# Patient Record
Sex: Female | Born: 1970
Health system: Southern US, Community
[De-identification: ages and names within clinical notes are randomized; demographics above are authoritative.]

## PROBLEM LIST (undated history)

## (undated) DIAGNOSIS — D649 Anemia, unspecified: Secondary | ICD-10-CM

## (undated) DIAGNOSIS — J309 Allergic rhinitis, unspecified: Secondary | ICD-10-CM

## (undated) DIAGNOSIS — D509 Iron deficiency anemia, unspecified: Secondary | ICD-10-CM

## (undated) DIAGNOSIS — Z86010 Personal history of colonic polyps: Secondary | ICD-10-CM

## (undated) HISTORY — DX: Iron deficiency anemia, unspecified: D50.9

## (undated) HISTORY — DX: Allergic rhinitis, unspecified: J30.9

## (undated) HISTORY — DX: Anemia, unspecified: D64.9

## (undated) HISTORY — DX: Personal history of colonic polyps: Z86.010

## (undated) HISTORY — PX: INTRAUTERINE DEVICE INSERTION: SHX323

---

## 1999-05-20 ENCOUNTER — Other Ambulatory Visit: Admission: RE | Admit: 1999-05-20 | Discharge: 1999-05-20 | Payer: Self-pay | Admitting: Obstetrics and Gynecology

## 2000-06-20 ENCOUNTER — Other Ambulatory Visit: Admission: RE | Admit: 2000-06-20 | Discharge: 2000-06-20 | Payer: Self-pay | Admitting: Obstetrics and Gynecology

## 2001-12-07 ENCOUNTER — Other Ambulatory Visit: Admission: RE | Admit: 2001-12-07 | Discharge: 2001-12-07 | Payer: Self-pay | Admitting: Obstetrics and Gynecology

## 2003-02-05 ENCOUNTER — Other Ambulatory Visit: Admission: RE | Admit: 2003-02-05 | Discharge: 2003-02-05 | Payer: Self-pay | Admitting: Obstetrics and Gynecology

## 2003-02-06 ENCOUNTER — Other Ambulatory Visit: Admission: RE | Admit: 2003-02-06 | Discharge: 2003-02-06 | Payer: Self-pay | Admitting: Obstetrics and Gynecology

## 2003-09-18 ENCOUNTER — Inpatient Hospital Stay (HOSPITAL_COMMUNITY): Admission: AD | Admit: 2003-09-18 | Discharge: 2003-09-22 | Payer: Self-pay | Admitting: Obstetrics and Gynecology

## 2003-09-19 ENCOUNTER — Encounter (INDEPENDENT_AMBULATORY_CARE_PROVIDER_SITE_OTHER): Payer: Self-pay | Admitting: Specialist

## 2003-09-23 ENCOUNTER — Encounter: Admission: RE | Admit: 2003-09-23 | Discharge: 2003-10-23 | Payer: Self-pay | Admitting: Obstetrics and Gynecology

## 2003-10-24 ENCOUNTER — Encounter: Admission: RE | Admit: 2003-10-24 | Discharge: 2003-11-23 | Payer: Self-pay | Admitting: Obstetrics and Gynecology

## 2003-12-22 ENCOUNTER — Encounter: Admission: RE | Admit: 2003-12-22 | Discharge: 2004-01-21 | Payer: Self-pay | Admitting: Obstetrics and Gynecology

## 2004-04-20 ENCOUNTER — Other Ambulatory Visit: Admission: RE | Admit: 2004-04-20 | Discharge: 2004-04-20 | Payer: Self-pay | Admitting: Obstetrics and Gynecology

## 2004-08-15 ENCOUNTER — Emergency Department (HOSPITAL_COMMUNITY): Admission: EM | Admit: 2004-08-15 | Discharge: 2004-08-15 | Payer: Self-pay | Admitting: Family Medicine

## 2005-09-24 ENCOUNTER — Inpatient Hospital Stay (HOSPITAL_COMMUNITY): Admission: RE | Admit: 2005-09-24 | Discharge: 2005-09-28 | Payer: Self-pay | Admitting: Obstetrics and Gynecology

## 2005-09-29 ENCOUNTER — Encounter: Admission: RE | Admit: 2005-09-29 | Discharge: 2005-10-29 | Payer: Self-pay | Admitting: Obstetrics and Gynecology

## 2005-10-26 ENCOUNTER — Other Ambulatory Visit: Admission: RE | Admit: 2005-10-26 | Discharge: 2005-10-26 | Payer: Self-pay | Admitting: Obstetrics and Gynecology

## 2005-10-30 ENCOUNTER — Encounter: Admission: RE | Admit: 2005-10-30 | Discharge: 2005-11-26 | Payer: Self-pay | Admitting: Obstetrics and Gynecology

## 2005-11-27 ENCOUNTER — Encounter: Admission: RE | Admit: 2005-11-27 | Discharge: 2005-12-27 | Payer: Self-pay | Admitting: Obstetrics and Gynecology

## 2005-12-28 ENCOUNTER — Encounter: Admission: RE | Admit: 2005-12-28 | Discharge: 2005-12-31 | Payer: Self-pay | Admitting: Obstetrics and Gynecology

## 2008-04-30 ENCOUNTER — Ambulatory Visit: Payer: Self-pay | Admitting: Internal Medicine

## 2008-04-30 DIAGNOSIS — D509 Iron deficiency anemia, unspecified: Secondary | ICD-10-CM

## 2008-04-30 DIAGNOSIS — Z860100 Personal history of colon polyps, unspecified: Secondary | ICD-10-CM | POA: Insufficient documentation

## 2008-04-30 DIAGNOSIS — D649 Anemia, unspecified: Secondary | ICD-10-CM

## 2008-04-30 DIAGNOSIS — H9190 Unspecified hearing loss, unspecified ear: Secondary | ICD-10-CM | POA: Insufficient documentation

## 2008-04-30 HISTORY — DX: Anemia, unspecified: D64.9

## 2008-04-30 HISTORY — DX: Personal history of colon polyps, unspecified: Z86.0100

## 2008-04-30 HISTORY — DX: Iron deficiency anemia, unspecified: D50.9

## 2008-04-30 HISTORY — DX: Personal history of colonic polyps: Z86.010

## 2008-09-06 HISTORY — PX: INTRAUTERINE DEVICE INSERTION: SHX323

## 2008-10-18 ENCOUNTER — Ambulatory Visit: Payer: Self-pay | Admitting: Internal Medicine

## 2008-10-18 DIAGNOSIS — R059 Cough, unspecified: Secondary | ICD-10-CM | POA: Insufficient documentation

## 2008-10-18 DIAGNOSIS — R5381 Other malaise: Secondary | ICD-10-CM

## 2008-10-18 DIAGNOSIS — R5383 Other fatigue: Secondary | ICD-10-CM

## 2008-10-18 DIAGNOSIS — J309 Allergic rhinitis, unspecified: Secondary | ICD-10-CM

## 2008-10-18 DIAGNOSIS — R05 Cough: Secondary | ICD-10-CM

## 2008-10-18 DIAGNOSIS — J069 Acute upper respiratory infection, unspecified: Secondary | ICD-10-CM | POA: Insufficient documentation

## 2008-10-18 DIAGNOSIS — N390 Urinary tract infection, site not specified: Secondary | ICD-10-CM

## 2008-10-18 HISTORY — DX: Allergic rhinitis, unspecified: J30.9

## 2008-10-18 LAB — CONVERTED CEMR LAB
Glucose, Urine, Semiquant: NEGATIVE
Nitrite: POSITIVE
Protein, U semiquant: NEGATIVE
WBC Urine, dipstick: NEGATIVE

## 2008-10-19 ENCOUNTER — Encounter: Payer: Self-pay | Admitting: Internal Medicine

## 2011-01-22 NOTE — Discharge Summary (Signed)
NAME:  Sarah Riley, Sarah Riley NO.:  [id number]   MEDICAL RECORD NO.:  [id number]          PATIENT TYPE:  INP   LOCATION:  9146                          FACILITY:  [facility]   PHYSICIAN:  Carrington Clamp, M.D. DATE OF BIRTH:  11/20/1970   DATE OF ADMISSION:  09/24/2005  DATE OF DISCHARGE:  09/28/2005                                 DISCHARGE SUMMARY   FINAL DIAGNOSES:  1.  Intrauterine gestation at 38+ weeks gestation.  2.  History of prior cesarean section, desires repeat cesarean section in      early labor.  3.  Thick meconium-stained amniotic fluid.   PROCEDURE:  Repeat low transverse cesarean section. Surgeon:  Dr. Conley Simmonds. Assistant:  Dr. Carrington Clamp. Complications:  None.   This 40 year old G3 P1-0-1-1 presents at 36 and four-sevenths weeks  gestation in early labor. The patient had had a prior cesarean section in  2005 and expressed her desires throughout this pregnancy for a repeat  cesarean section. The patient declined any trial of vaginal delivery. The  patient's antepartum course at this point was complicated by some late  prenatal care. The patient was not seen in our office to close to 13-14  weeks, and otherwise the antepartum course was uncomplicated. She was taken  to the operating room on September 24, 2005, by Dr. Conley Simmonds where a repeat  low transverse cesarean section was performed with the delivery of a 7-pound  6-ounce female infant with Apgars of 9 and 9. Delivery went without  complications. The patient's postoperative course was benign without any  significant fevers. The baby was in the NICU secondary to the heavy, thick  meconium fluid, was now breathing on its own. The patient was kept in the  hospital until postoperative day #4. Was sent home on a regular diet, told  to decrease activities, told to continue prenatal vitamins, was given  Percocet one to two every 4 hours as needed for pain, told she could use  over-the-counter  ibuprofen up to 600 mg every 6 hours as needed for pain,  was to follow up in the office in 4-6 weeks.   LABORATORY ON DISCHARGE:  The patient had a hemoglobin of 8.9; white blood  cell count of 10.0; and platelets of 195,000.      Leilani Able, P.A.-C.      Carrington Clamp, M.D.  Electronically Signed    MB/MEDQ  D:  10/11/2005  T:  10/11/2005  Job:  [id number]

## 2011-01-22 NOTE — Op Note (Signed)
NAME:  Sarah Riley, Sarah Riley NO.:  [id number]   MEDICAL RECORD NO.:  [id number]                   PATIENT TYPE:  INP   LOCATION:  9106                                 FACILITY:  [facility]   PHYSICIAN:  Randye Lobo, M.D.                DATE OF BIRTH:  17-Apr-1971   DATE OF PROCEDURE:  09/19/2003  DATE OF DISCHARGE:                                 OPERATIVE REPORT   PREOPERATIVE DIAGNOSES:  1. Intrauterine gestation at 37 + 6 weeks.  2. Nonreassuring fetal heart rate tracing.  3. Meconium-stained amniotic fluid.   POSTOPERATIVE DIAGNOSES:  1. Intrauterine gestation at 26 + 6 weeks.  2. Nonreassuring fetal heart rate tracing.  3. Thickly-stained meconium amniotic fluid.  4. Short umbilical cord.   PROCEDURE:  Primary low segment transverse cesarean section.   SURGEON:  Randye Lobo, M.D.   ANESTHESIA:  Epidural.   FLUIDS REPLACED:  1600 mL Ringer's lactate.   ESTIMATED BLOOD LOSS:  800 mL.   URINE OUTPUT:  250 mL.   COMPLICATIONS:  None.   INDICATION FOR PROCEDURE:  The patient is a 40 year old gravida 2, para 0-0-  1-0, African-American female who presented on September 18, 2003, at 35 + 5  weeks' gestation with contractions every three to five minutes.  The patient  had been seen in the office earlier that day and had a cervical exam which  was consistent with 1 cm dilation, 50% effacement, and the vertex at the -2  station.  The patient's antepartum course had been unremarkable.  Upon  admission, the patient's cervix was noted to be a loose 1 cm of dilation.  The fetal heart rate tracing was reassuring and then became reactive with  oral hydration.  The patient's contractions initially were irregular and  then they became consistent, occurring every four to five minutes, and were  noted to have increased intensity.  The patient went on to progress through  early latent-phase labor and was admitted when her cervix was 2-3 cm  dilated.  Prior to  transferring from the maternity admissions unit down to  labor and delivery, the fetal heart rate tracing was noted to develop a  series of a few late fetal heart rate decelerations.  The patient did have  an IV at this point.   After she arrived in labor and delivery, the patient was re-examined and the  cervix was found to be a tight 3 cm dilated and 60% effaced with the vertex  at a -3 station.  Artificial rupture of membranes was performed, and the  amniotic fluid was noted to be blood-stained with possible meconium.  A  fetal scalp electrode was placed and the patient was closely observed.  The  fetal heart rate tracing did become reactive with an occasional variable  deceleration and one which was noted to have a late component to it.  The  fetus went  on to develop a spontaneous episode of bradycardia while sitting  up in the bed.  Resuscitation was performed with oxygen, an IV fluid bolus,  and position changes.  There was no evidence of a prolapsed umbilical cord  at this time.   The patient became uncomfortable with contractions, and the fetal heart rate  tracing again became reassuring, and the patient then received an epidural  for anesthesia, at which time her cervix was still 3 cm dilated.  The  contractions became inadequate and Pitocin augmentation of her labor was  performed after the risks and benefits were reviewed with the family.   Following the epidural, there were again two episodes of bradycardia of the  fetus, at which time maternal hypotension was diagnosed.  The patient did  receive ephedrine 10 mg x3.  There were moderate variables that followed the  bradycardia, and the Pitocin was therefore discontinued.  IV fluid was  administered in addition to oxygen, and an amnioinfusion was begun.  The  variables resolved; however, the fetal heart rate tracing developed  recurrent late decelerations with a baseline of 160 beats per minute.  A  recommendation was made at  this time to proceed with a primary low segment  transverse cesarean section based on the fetal heart rate  tracing and the  inability to administer Pitocin to augment the patient's labor.  There was  also a suspicion of meconium-stained amniotic fluid.  A plan was made to  proceed after risks, benefits, and alternatives were discussed with the  patient and her family.   FINDINGS:  A viable female was delivered at 1:26 a.m.  Apgars were 7 at one  minute and 8 at five minutes.  There was thickly-stained meconium amniotic  fluid.  The weight of the newborn was later noted to be 7 pounds 14 ounces.  The umbilical cord was noted to be short and the placenta was deeply stained  with meconium.  The umbilical cord had three vessels and a normal insertion.  The cord pH was noted to be 7.08.  The newborn was vigorous at birth and did  have a spontaneous cry.  The uterus, tubes, and ovaries were all noted to be  normal.   SPECIMENS:  The placenta was sent to pathology.   DESCRIPTION OF PROCEDURE:  The patient was escorted from her labor and  delivery suite down to the operating room with her labor team.  In the  operating room, the patient's epidural was dosed for surgical anesthesia.  The fetal heart rate tracing was monitored at 125-135 before beginning  surgery.  The patient was placed in the supine position with a left lateral  tilt and the abdomen was sterilely prepped and then draped.  A Foley  catheter had been previously placed inside the bladder.   A Pfannenstiel incision was created sharply with a scalpel.  This was  carried down to the level of the fascia using monopolar cautery.  The fascia  was then incised in the midline and the incision was carried out bilaterally  with the Mayo scissors.  Rectus muscles were dissected off of the overlying  fascia both superiorly and inferiorly.  The rectus muscles were then sharply divided in the midline.  The parietal peritoneum was elevated with  two  hemostat clamps and was entered sharply.  The incision was extended  cranially and caudally.   The bladder retractor was placed over the bladder and the lower uterine  segment was exposed.  A  bladder flap was created sharply.  A transverse  lower uterine segment incision was then created sharply with the scalpel.  The incision was extended bilaterally and in upward fashion with the bandage  scissors.  Thick meconium amniotic fluid was noted.  A hand was inserted  through the uterine incision and the vertex was elevated without difficulty.  The nares and mouth were then suctioned with the DeLee suction.  The  remainder of the newborn infant was then delivered and the cord was doubly  clamped and cut.  The infant did have a spontaneous cry, and the bulb  syringe was therefore used to continue to suction the mouth.  The newborn  was carried over to the waiting pediatricians in vigorous condition.  Both a  cord pH and cord blood sample were obtained at this time, and the placenta  was then manually extracted and sent to pathology.   The uterus was exteriorized for its closure.  A moistened lap pad was used  to wipe any remaining products of conception from within the uterine cavity,  and there were none.  The uterus was closed with a running locked suture of  #1 chromic.  There was some bleeding noted along the inferior aspect of the  lower uterine segment where the chromic suture had pulled through.  A  running locked suture of #1 chromic was used to repair this defect.  A  series of approximately four figure-of-eight sutures of #1 chromic were also  placed along the mid- and right side of the lower uterine segment incision  where there was bleeding from vessels.  This created excellent hemostasis.  The peritoneal cavity was then suctioned of any remaining clot and the  uterus was returned to the peritoneal cavity.  Irrigation and suctioning was  then performed of the peritoneal cavity  prior to its closure.   The peritoneum was closed with a running suture of 3-0 Vicryl.  The rectus  muscles were reapproximated in the midline with figure-of-eight sutures of  #1 chromic.  The fascia was closed with a running suture of 0 Vicryl.  The  subcutaneous tissue was irrigated and made hemostatic with monopolar  cautery.  Staples were placed over the incision, followed by a sterile  bandage.  There were no complications to the procedure.  All needle,  instrument, and sponge counts were correct.                                               Randye Lobo, M.D.    BES/MEDQ  D:  09/19/2003  T:  09/19/2003  Job:  [id number]

## 2011-01-22 NOTE — Op Note (Signed)
NAME:  Sarah Riley, Sarah Riley NO.:  [id number]   MEDICAL RECORD NO.:  [id number]          PATIENT TYPE:  INP   LOCATION:  9146                          FACILITY:  [facility]   PHYSICIAN:  Randye Lobo, M.D.   DATE OF BIRTH:  [birth year]   DATE OF PROCEDURE:  09/24/2005  DATE OF DISCHARGE:                                 OPERATIVE REPORT   PREOPERATIVE DIAGNOSES:  1.  Intrauterine gestation at 38 plus weeks.  2.  History of prior cesarean section, desires repeat cesarean section.  3.  Early labor.   POSTOPERATIVE DIAGNOSES:  1.  Intrauterine gestation at 25 plus 4 weeks.  2.  History of prior cesarean section, desires repeat cesarean section.  3.  Early labor.  4.  Thick meconium-stained amniotic fluid.   PROCEDURE:  Repeat low segment transverse cesarean section.   SURGEON:  Conley Simmonds, M.D.   ASSISTANT:  Carrington Clamp, M.D.   ANESTHESIA:  Spinal.   IV FLUIDS:  800 mL Ringer's lactate.   ESTIMATED BLOOD LOSS:  500 mL.   URINE OUTPUT:  150 mL.   COMPLICATIONS:  None.   INDICATIONS FOR THE PROCEDURE:  The patient is a 40 year old gravida 3, para  1-0-1-1, female at 75 plus 4 weeks' gestation who has a history of a prior C-  section for nonreassuring fetal assessment in 2005, who throughout her  current pregnancy requested a repeat cesarean section.  The patient upon  arrival to the [facility] for her cesarean section reported regular  contractions which were strong.  The patient declined again a trial of  vaginal delivery, and a plan was made to proceed with a repeat cesarean  section after risks, benefits, and alternatives were discussed with her.   FINDINGS:  A viable female was delivered at 12:43 p.m.  Apgars were 9 at one  minute and 9 at five minutes.  The weight was 7 pounds 6 ounces.  The  amniotic fluid was noted to have thickly-stained meconium, and the placenta  was meconium-stained and had a normal insertion of a three-vessel cord.  The  patient had a normal uterus, tubes, and ovaries.   SPECIMENS:  The placenta was sent to the lab for cord blood donation.   PROCEDURE:  The patient was reidentified in the preoperative hold area.  The  patient was taken down to the operating room, where a spinal anesthetic was  administered.  The patient was placed in the supine position with a left  lateral tilt.  The abdomen was sterilely prepped and draped and a Foley  catheter was placed inside the bladder.   A Pfannenstiel incision was created along the line of the patient's previous  incision.  There was a keloid from her previous surgery, and this was  excised and removed.  The incision was carried down to the fascia using a  scalpel.  The fascia was incised in the midline and the fascial incision was  extended with the Mayo scissors bilaterally.  The rectus muscles were  dissected off of the fascia superiorly and inferiorly.  The rectus muscles  were then sharply divided in the midline.  The parietal peritoneum was  elevated with two hemostat clamps and was entered sharply.  The incision was  extended cranially and caudally.   The lower uterine segment was exposed and the bladder flap was sharply  created.  A transverse lower uterine segment incision was created with the  scalpel.  The incision was extended bluntly and bilaterally in an upward  fashion.  Membranes were ruptured and thick meconium was appreciated.  The  vertex was delivered and the nares and mouth were suctioned with the DeLee  suction.  The remainder of the infant was delivered without difficulty.  The  cord was doubly clamped and cut and the newborn was carried over to the  awaiting pediatricians.  The newborn was noted to have a spontaneous  respiration prior to the pediatricians attending to her.  The newborn was  noted to be vigorous and crying.   Cord blood was obtained and the placenta was manually extracted and set  aside for the cord blood donation  team.  The uterus was exteriorized.  At  this time a moistened lap pad was used to wipe the uterine cavity clean.  The uterine incision was closed with a double-layered closure of #1 chromic.  The first was a running locked layer and the second was an imbricating  layer.  The visceral peritoneum overlying the lower uterine segment was  closed, closing the bladder flap, by placing a running suture of 2-0 Vicryl.   The uterus was then returned to the peritoneal cavity, which was irrigated  and suctioned.  The uterine incision was hemostatic.  The abdomen was closed  at this time.  A 3-0 Vicryl suture was used to close the parietal peritoneum  in a running fashion.  The rectus muscles were reapproximated by placing  interrupted sutures of #1 chromic.  The fascia was closed with a running  suture of 0 Vicryl.  The subcutaneous tissue was made hemostatic with  monopolar cautery.  The skin was closed with staples and a sterile pressure  bandage was placed over this.   This concluded the patient's surgery.  There were no complications.  All  needle, instrument, and sponge counts were correct.      Randye Lobo, M.D.  Electronically Signed     BES/MEDQ  D:  09/24/2005  T:  09/24/2005  Job:  [id number]

## 2011-01-22 NOTE — Discharge Summary (Signed)
NAME:  Sarah Riley, Sarah Riley NO.:  [id number]   MEDICAL RECORD NO.:  [id number]                   PATIENT TYPE:  INP   LOCATION:  9106                                 FACILITY:  [facility]   PHYSICIAN:  Malva Limes, M.D.                 DATE OF BIRTH:  [birth year]   DATE OF ADMISSION:  09/18/2003  DATE OF DISCHARGE:  09/22/2003                                 DISCHARGE SUMMARY   FINAL DIAGNOSES:  1. Intrauterine pregnancy at 22 and six-sevenths weeks gestation.  2. Nonreassuring fetal heart tracing.  3. Thickly-stained meconium amniotic fluid.  4. Short umbilical cord.   PROCEDURE:  Primary low segment transverse cesarean section.  Surgeon:  Dr.  Conley Simmonds.  Complications:  None.   This 40 year old G2 P0-0-1-0 presented on September 18, 2003 in active labor.  The patient had been seen earlier in the office that day and had a cervical  exam which showed about 1 cm dilated, 50% effaced, and -2 station.  Upon  admission the patient's cervix was a loose 1.  The patient's antepartum  course had been uncomplicated.  She was nonimmune for rubella but otherwise  had no complication.  The patient is admitted at this time in active labor.  The patient progressed through the early latent phase of labor and was  admitted.  Her cervix was about 2-3 cm dilated.  During this time the fetal  heart rate tracing was noted to develop a series of a few late fetal heart  rate decelerations.  At this point the patient was about 3 cm dilated.  AROM  was performed and fluid was noted to be thickly-stained with meconium.  Fetal scalp electrodes were placed at this time.  The patient did receive an  epidural and Pitocin augmentation was begun.  After the epidural there was  some fetal bradycardia at which time maternal hypotension was diagnosed and  the patient did receive ephedrine 10 mg three times.  Pitocin was  discontinued and IV fluids were administered, and oxygen, and  amnioinfusion  was begun. The variables resolved.  However, the heart rate tracing  developed recurrent late decelerations.  At this time a decision was made to  proceed with a cesarean section.  The patient was taken to the operating  room by Dr. Conley Simmonds where a primary low transverse cesarean section was  performed with the delivery of a 7-pound 14-ounce female infant with Apgars of  7 and 8.  The delivery went without complication.  The patient's  postoperative course was benign without significant fevers.  The patient's  little boy was circumcised.  The patient was felt ready for discharge on  postoperative day #3, was sent home on a regular diet, told to decrease  activities, told to continue prenatal vitamins, was given Tylox one to two  q.4h. as needed for pain, told to follow up in the office in  4 weeks.  The  patient did receive her rubella vaccination prior to discharge and her labs  upon discharge showed hemoglobin of 10.7, white blood cell count of 13.0.     Leilani Able, P.A.-C.                Malva Limes, M.D.    MB/MEDQ  D:  10/21/2003  T:  10/21/2003  Job:  [id number]

## 2011-05-31 ENCOUNTER — Other Ambulatory Visit: Payer: Self-pay | Admitting: Obstetrics and Gynecology

## 2011-08-04 ENCOUNTER — Other Ambulatory Visit: Payer: Self-pay | Admitting: Obstetrics and Gynecology

## 2011-08-09 ENCOUNTER — Ambulatory Visit (INDEPENDENT_AMBULATORY_CARE_PROVIDER_SITE_OTHER): Payer: BC Managed Care – PPO | Admitting: Internal Medicine

## 2011-08-09 ENCOUNTER — Encounter: Payer: Self-pay | Admitting: Internal Medicine

## 2011-08-09 VITALS — BP 102/70 | HR 110 | Temp 98.5°F | Ht 62.0 in | Wt 140.2 lb

## 2011-08-09 DIAGNOSIS — Z0000 Encounter for general adult medical examination without abnormal findings: Secondary | ICD-10-CM | POA: Insufficient documentation

## 2011-08-09 DIAGNOSIS — J019 Acute sinusitis, unspecified: Secondary | ICD-10-CM | POA: Insufficient documentation

## 2011-08-09 MED ORDER — LEVOFLOXACIN 250 MG PO TABS: 250.0000 mg | ORAL_TABLET | Freq: Every day | ORAL | Status: AC

## 2011-08-09 MED ORDER — HYDROCODONE-HOMATROPINE 5-1.5 MG/5ML PO SYRP: 5.0000 mL | ORAL_SOLUTION | Freq: Four times a day (QID) | ORAL | Status: AC | PRN

## 2011-08-09 NOTE — Patient Instructions (Signed)
Take all new medications as prescribed Continue all other medications as before You can also take Delsym OTC for cough, and/or Mucinex (or it's generic off brand) for congestion  

## 2011-08-09 NOTE — Assessment & Plan Note (Signed)
Mild to mod, for antibx course,  to f/u any worsening symptoms or concerns 

## 2011-08-09 NOTE — Progress Notes (Signed)
  Subjective:    Patient ID: Sarah Riley, female    DOB: 04/23/1971, 40 y.o.   MRN: [id number]  HPI  Here with 3 days acute onset fever, facial pain, pressure, general weakness and malaise, and greenish d/c, with slight ST, but little to no cough and Pt denies chest pain, increased sob or doe, wheezing, orthopnea, PND, increased LE swelling, palpitations, dizziness or syncope.  Pt denies new neurological symptoms such as new headache, or facial or extremity weakness or numbness   Pt denies polydipsia, polyuria. Not pregnant Past Medical History  Diagnosis Date  . ANEMIA-NOS 04/30/2008  . ANEMIA-IRON DEFICIENCY 04/30/2008  . ALLERGIC RHINITIS 10/18/2008  . COLONIC POLYPS, HX OF 04/30/2008   No past surgical history on file.  reports that she has never smoked. She does not have any smokeless tobacco history on file. Her alcohol and drug histories not on file. family history is not on file. No Known Allergies No current outpatient prescriptions on file prior to visit.   Review of Systems All otherwise neg per pt    Objective:   Physical Exam BP 102/70  Pulse 110  Temp(Src) 98.5 F (36.9 C) (Oral)  Ht 5\' 2"  (1.575 m)  Wt 140 lb 4 oz (63.617 kg)  BMI 25.65 kg/m2  SpO2 99% Physical Exam  VS noted, mild ill Constitutional: Pt appears well-developed and well-nourished.  HENT: Head: Normocephalic.  Right Ear: External ear normal.  Left Ear: External ear normal.  Bilat tm's mild erythema.  Sinus tender bilat.  Pharynx mild erythema Eyes: Conjunctivae and EOM are normal. Pupils are equal, round, and reactive to light.  Neck: Normal range of motion. Neck supple.  Cardiovascular: Normal rate and regular rhythm.   Pulmonary/Chest: Effort normal and breath sounds normal.  Neurological: Pt is alert. No cranial nerve deficit.  Skin: Skin is warm. No erythema.  Psychiatric: Pt behavior is normal. Thought content normal.     Assessment & Plan:

## 2011-09-13 ENCOUNTER — Telehealth: Payer: Self-pay

## 2011-09-13 MED ORDER — LEVOFLOXACIN 250 MG PO TABS: 250.0000 mg | ORAL_TABLET | Freq: Every day | ORAL | Status: AC

## 2011-09-13 NOTE — Telephone Encounter (Signed)
Ok for refill, please ask to make she is confident she is not pregnant  Done escript

## 2011-09-13 NOTE — Telephone Encounter (Signed)
Pt called stating that she was treated with an ABX last month for a sinus infection. Pt began feeling better and did not complete course, she had three days remaining. Pt now says that sxs have returned (congestion, nasal drainage and sinus pressure and pain). Pt is requesting a refill of ABX, please advise.

## 2011-09-13 NOTE — Telephone Encounter (Signed)
Called to inform prescription requested sent to pharmacy

## 2011-12-14 ENCOUNTER — Encounter (HOSPITAL_COMMUNITY): Payer: Self-pay | Admitting: *Deleted

## 2011-12-14 ENCOUNTER — Emergency Department (HOSPITAL_COMMUNITY)
Admission: EM | Admit: 2011-12-14 | Discharge: 2011-12-14 | Disposition: A | Discharge status: Home / Self Care | Payer: BC Managed Care – PPO | Source: Home / Self Care | Attending: Emergency Medicine | Admitting: Emergency Medicine

## 2011-12-14 DIAGNOSIS — N390 Urinary tract infection, site not specified: Secondary | ICD-10-CM

## 2011-12-14 LAB — POCT URINALYSIS DIP (DEVICE)
Glucose, UA: NEGATIVE mg/dL
Ketones, ur: NEGATIVE mg/dL
Specific Gravity, Urine: 1.02 (ref 1.005–1.030)

## 2011-12-14 LAB — POCT PREGNANCY, URINE: Preg Test, Ur: NEGATIVE

## 2011-12-14 MED ORDER — CEPHALEXIN 500 MG PO CAPS: 500.0000 mg | ORAL_CAPSULE | Freq: Three times a day (TID) | ORAL | Status: AC

## 2011-12-14 NOTE — ED Notes (Signed)
Slight burning with urination with frequency.  She denies fever/flank pain.

## 2011-12-14 NOTE — ED Provider Notes (Signed)
History     CSN: [id number]  Arrival date & time 12/14/11  4098   First MD Initiated Contact with Patient 12/14/11 0815      Chief Complaint  Patient presents with  . Urinary Tract Infection    (Consider location/radiation/quality/duration/timing/severity/associated sxs/prior treatment) HPI Comments: Patient has been experiencing minor burning and pressure with urination increased frequency for about a week. The last 2 days have been getting worse decided to come in to be checked as she thought initially the symptoms were going to resolve on their own. Patient denies any fevers, nausea vomiting abdominal pain or flank pain. Not tried any medicines over-the-counter  Patient is a 41 y.o. female presenting with urinary tract infection. The history is provided by the patient.  Urinary Tract Infection This is a new problem. The problem occurs constantly. The problem has not changed since onset.Associated symptoms include abdominal pain. Pertinent negatives include no chest pain and no headaches. She has tried nothing for the symptoms.    Past Medical History  Diagnosis Date  . ANEMIA-NOS 04/30/2008  . ANEMIA-IRON DEFICIENCY 04/30/2008  . ALLERGIC RHINITIS 10/18/2008  . COLONIC POLYPS, HX OF 04/30/2008    Past Surgical History  Procedure Date  . Cesarean section     Family History  Problem Relation Age of Onset  . Hypertension Mother     History  Substance Use Topics  . Smoking status: Never Smoker   . Smokeless tobacco: Not on file  . Alcohol Use: No    OB History    Grav Para Term Preterm Abortions TAB SAB Ect Mult Living                  Review of Systems  Constitutional: Negative for fever, chills, activity change and appetite change.  HENT: Negative for neck pain.   Cardiovascular: Negative for chest pain.  Gastrointestinal: Positive for abdominal pain.  Genitourinary: Positive for dysuria and frequency. Negative for hematuria, flank pain, vaginal bleeding, vaginal  discharge and vaginal pain.  Musculoskeletal: Negative for back pain.  Skin: Negative for rash.  Neurological: Negative for headaches.    Allergies  Review of patient's allergies indicates no known allergies.  Home Medications   Current Outpatient Rx  Name Route Sig Dispense Refill  . CEPHALEXIN 500 MG PO CAPS Oral Take 1 capsule (500 mg total) by mouth 3 (three) times daily. 30 capsule 0    BP 94/66  Pulse 84  Temp(Src) 98.4 F (36.9 C) (Oral)  Resp 14  SpO2 100%  Physical Exam  Constitutional: She appears well-developed and well-nourished. She is active.  Non-toxic appearance. She does not have a sickly appearance. She does not appear ill. No distress.  HENT:  Head: Normocephalic.  Eyes: Conjunctivae are normal.  Abdominal: Soft. She exhibits no distension and no mass. There is no tenderness. There is no rigidity, no rebound, no guarding, no CVA tenderness and negative Murphy's sign.  Genitourinary: No vaginal discharge found.  Skin: No rash noted.    ED Course  Procedures (including critical care time)  Labs Reviewed  POCT URINALYSIS DIP (DEVICE) - Abnormal; Notable for the following:    Bilirubin Urine SMALL (*)    Hgb urine dipstick TRACE (*)    Protein, ur 30 (*)    Leukocytes, UA SMALL (*) Biochemical Testing Only. Please order routine urinalysis from main lab if confirmatory testing is needed.   All other components within normal limits  POCT PREGNANCY, URINE   No results found.   1.  Urinary tract infection       MDM  Dysuria with abnormal urine dip and symptomatic. Will treat patient nonresectable prefer culture at this point        Jimmie Molly, MD 12/14/11 [phone]

## 2011-12-14 NOTE — Discharge Instructions (Signed)
Urinary Tract Infection in Pregnancy A urinary tract infection (UTI) is a bacterial infection of the urinary tract. Infection of the urinary tract can include the ureters, kidneys (pyelonephritis), bladder (cystitis), and urethra (urethritis). All pregnant women should be screened for bacteria in the urinary tract. Identifying and treating a UTI will decrease the risk of preterm labor and developing more serious infections in both the mother and baby. CAUSES Bacteria germs cause almost all UTIs. There are many factors that can increase your chances of getting a UTI during pregnancy. These include:  Having a short urethra.   Poor toilet and hygiene habits.   Sexual intercourse.   Blockage of urine along the urinary tract.   Problems with the pelvic muscles or nerves.   Diabetes.   Obesity.   Bladder problems after having several children.   Previous history of UTI.  SYMPTOMS   Pain, burning, or a stinging feeling when urinating.   Suddenly feeling the need to urinate right away (urgency).   Loss of bladder control (urinary incontinence).   Frequent urination, more than is common with pregnancy.   Lower abdominal or back discomfort.   Bad smelling urine.   Cloudy urine.   Blood in the urine (hematuria).   Fever.  When the kidneys are infected, the symptoms may be:  Back pain.   Flank pain on the right side more so than the left.   Fever.   Chills.   Nausea.   Vomiting.  DIAGNOSIS   Urine tests.   Additional tests and procedures may include:   Ultrasound of the kidneys, ureters, bladder, and urethra.   Looking in the bladder with a lighted tube (cystoscopy).   Certain X-ray studies only when absolutely necessary.  Finding out the results of your test Ask when your test results will be ready. Make sure you get your test results. TREATMENT  Antibiotic medicine by mouth.   Antibiotics given through the vein (intravenously), if needed.  HOME CARE  INSTRUCTIONS   Take your antibiotics as directed. Finish them even if you start to feel better. Only take medicine as directed by your caregiver.   Drink enough fluids to keep your urine clear or pale yellow.   Do not have sexual intercourse until the infection is gone and your caregiver says it is okay.   Make sure you are tested for UTIs throughout your pregnancy if you get one. These infections often come back.  Preventing a UTI in the future:  Practice good toilet habits. Always wipe from front to back. Use the tissue only once.   Do not hold your urine. Empty your bladder as soon as possible when the urge comes.   Do not douche or use deodorant sprays.   Wash with soap and warm water around the genital area and the anus.   Empty your bladder before and after sexual intercourse.   Wear underwear with a cotton crotch.   Avoid caffeine and carbonated drinks. They can irritate the bladder.   Drink cranberry juice or take cranberry pills. This may decrease the risk of getting a UTI.   Do not drink alcohol.   Keep all your appointments and tests as scheduled.  SEEK MEDICAL CARE IF:   Your symptoms get worse.   You are still having fevers 2 or more days after treatment begins.   You develop a rash.   You feel that you are having problems with medicines prescribed.   You develop abnormal vaginal discharge.  SEEK IMMEDIATE MEDICAL   CARE IF:   You develop back or flank pain.   You develop chills.   You have blood in your urine.   You develop nausea and vomiting.   You develop contractions of your uterus.   You have a gush of fluid from the vagina.  MAKE SURE YOU:   Understand these instructions.   Will watch your condition.   Will get help right away if you are not doing well or get worse.  Document Released: 12/18/2010 Document Revised: 08/12/2011 Document Reviewed: 12/18/2010 Exit[employer] Patient Information 2012 [employer]. 

## 2012-01-19 ENCOUNTER — Other Ambulatory Visit: Payer: Self-pay | Admitting: Gynecology

## 2012-01-19 DIAGNOSIS — R928 Other abnormal and inconclusive findings on diagnostic imaging of breast: Secondary | ICD-10-CM

## 2012-01-25 ENCOUNTER — Other Ambulatory Visit: Payer: BC Managed Care – PPO

## 2012-02-10 ENCOUNTER — Encounter: Payer: Self-pay | Admitting: Internal Medicine

## 2012-02-10 ENCOUNTER — Ambulatory Visit (INDEPENDENT_AMBULATORY_CARE_PROVIDER_SITE_OTHER): Payer: BC Managed Care – PPO | Admitting: Internal Medicine

## 2012-02-10 VITALS — BP 100/68 | HR 99 | Temp 99.0°F | Ht 62.0 in | Wt 140.1 lb

## 2012-02-10 DIAGNOSIS — J309 Allergic rhinitis, unspecified: Secondary | ICD-10-CM

## 2012-02-10 DIAGNOSIS — G44009 Cluster headache syndrome, unspecified, not intractable: Secondary | ICD-10-CM | POA: Insufficient documentation

## 2012-02-10 MED ORDER — HYDROCODONE-ACETAMINOPHEN 5-325 MG PO TABS: ORAL_TABLET | ORAL | Status: DC

## 2012-02-10 NOTE — Patient Instructions (Signed)
Take all new medications as prescribed - the pain medication for future attacks if needed You will be contacted regarding the referral for: MRI Please call for prednisone course if occurs again Another option would be to go directly to the ER for 100% oxygen if this is practical, or even medication that we give for migraine such as Imitrex, or preventive medication such as Topamax If this does not seem to help, we could also refer to Neurology if needed

## 2012-02-10 NOTE — Progress Notes (Signed)
  Subjective:    Patient ID: Sarah Riley, female    DOB: 07-Sep-1970, 41 y.o.   MRN: [id number]  HPI  Here with c/o HA - about every 6 months severe pain, one sided , behind the eye with some eye tearing, but involves the face towards the nose, with nausea, can last several days, throbbing when sitting/standing up from lying down , advil cold and sinus not working., and no light sensitivity but may be some sound sensitivity, no actual vomiting.   Pt denies fever, wt loss, night sweats, loss of appetite, or other constitutional symptoms. No head trauma. No hx of imaging.  Overall started about 2 yrs ago. Pt denies chest pain, increased sob or doe, wheezing, orthopnea, PND, increased LE swelling, palpitations, dizziness or syncope.   Pt denies polydipsia, polyuria. Past Medical History  Diagnosis Date  . ANEMIA-NOS 04/30/2008  . ANEMIA-IRON DEFICIENCY 04/30/2008  . ALLERGIC RHINITIS 10/18/2008  . COLONIC POLYPS, HX OF 04/30/2008   Past Surgical History  Procedure Date  . Cesarean section     reports that she has never smoked. She does not have any smokeless tobacco history on file. She reports that she does not drink alcohol or use illicit drugs. family history includes Hypertension in her mother. Allergies  Allergen Reactions  . Oxycodone Other (See Comments)    Feels loopy   No current outpatient prescriptions on file prior to visit.   Review of Systems Review of Systems  Constitutional: Negative for diaphoresis and unexpected weight change.  HENT: Negative for drooling and tinnitus.   Eyes: Negative for visual disturbance  Respiratory: Negative for choking and stridor.   Gastrointestinal: Negative for vomiting and blood in stool.  Genitourinary: Negative for hematuria and decreased urine volume.  Musculoskeletal: Negative for gait problem.  Skin: Negative for color change and wound.  Neurological: Negative for tremors and numbness.  Psychiatric/Behavioral: Negative for decreased  concentration. The patient is not hyperactive.       Objective:   Physical Exam BP 100/68  Pulse 99  Temp(Src) 99 F (37.2 C) (Oral)  Ht 5\' 2"  (1.575 m)  Wt 140 lb 2 oz (63.56 kg)  BMI 25.63 kg/m2  SpO2 99% Physical Exam  VS noted Constitutional: Pt appears well-developed and well-nourished.  HENT: Head: Normocephalic.  Right Ear: External ear normal.  Left Ear: External ear normal.  Eyes: Conjunctivae and EOM are normal. Pupils are equal, round, and reactive to light.  Neck: Normal range of motion. Neck supple.  Cardiovascular: Normal rate and regular rhythm.   Pulmonary/Chest: Effort normal and breath sounds normal.  Neurological: Pt is alert. No cranial nerve deficit. motor/dtr intact Skin: Skin is warm. No erythema.  Psychiatric: Pt behavior is normal. Thought content normal.     Assessment & Plan:

## 2012-02-12 ENCOUNTER — Encounter: Payer: Self-pay | Admitting: Internal Medicine

## 2012-02-12 NOTE — Assessment & Plan Note (Addendum)
Hx and exam most c/w this;  No current pain, Gave pt info today, for MRI - r/o malignancy, consider neuro consult, for pain med for future attack, consider imitrex or even topamax for prevention

## 2012-02-12 NOTE — Assessment & Plan Note (Signed)
stable overall by hx and exam,  and pt to continue medical treatment as before - otc allegra prn, not related to current HA

## 2013-11-27 ENCOUNTER — Encounter: Payer: Self-pay | Admitting: Family Medicine

## 2013-11-27 ENCOUNTER — Ambulatory Visit (INDEPENDENT_AMBULATORY_CARE_PROVIDER_SITE_OTHER): Payer: BC Managed Care – PPO | Admitting: Family Medicine

## 2013-11-27 VITALS — BP 98/70 | HR 74

## 2013-11-27 DIAGNOSIS — M999 Biomechanical lesion, unspecified: Secondary | ICD-10-CM | POA: Insufficient documentation

## 2013-11-27 DIAGNOSIS — M545 Low back pain, unspecified: Secondary | ICD-10-CM

## 2013-11-27 DIAGNOSIS — M533 Sacrococcygeal disorders, not elsewhere classified: Secondary | ICD-10-CM

## 2013-11-27 MED ORDER — METHYLPREDNISOLONE ACETATE 80 MG/ML IJ SUSP
80.0000 mg | Freq: Once | INTRAMUSCULAR | Status: AC
  Administered 2013-11-27: 80 mg via INTRAMUSCULAR

## 2013-11-27 MED ORDER — TRAMADOL HCL 50 MG PO TABS: 50.0000 mg | ORAL_TABLET | Freq: Every evening | ORAL | Status: DC | PRN

## 2013-11-27 MED ORDER — KETOROLAC TROMETHAMINE 60 MG/2ML IM SOLN
60.0000 mg | Freq: Once | INTRAMUSCULAR | Status: AC
  Administered 2013-11-27: 60 mg via INTRAMUSCULAR

## 2013-11-27 NOTE — Assessment & Plan Note (Signed)
We discussed diagnosis, prognosis and rehabilitation exercises Home exercises given for range of motion as well as strengthening exercise I will be beneficial. Toradol and Depo-Medrol given today She responded well to osteopathic manipulation with 50% decrease in pain. Patient and will come back in 2 weeks for further evaluation Tramadol given if necessary for sleep and pain

## 2013-11-27 NOTE — Patient Instructions (Signed)
Very nice to meet you Ice 20 minutes 2 times a day Two injections today should help Aleve twice daily for next 3 days starting tomorrow no matter what.  Try these exercsies most days of the week Sacroiliac Joint Mobilization and Rehab 1. Work on pretzel stretching, shoulder back and leg draped in front. 3-5 sets, 30 sec.. 2. hip abductor rotations. standing, hip flexion and rotation outward then inward. 3 sets, 15 reps. when can do comfortably, add ankle weights starting at 2 pounds.  3. cross over stretching - shoulder back to ground, same side leg crossover. 3-5 sets for 30 min..  4. rolling up and back knees to chest and rocking. 5. sacral tilt - 5 sets, hold for 5-10 seconds  Come back in 2 week sto make sure you are better and maybe more manipulation.

## 2013-11-27 NOTE — Assessment & Plan Note (Signed)
Decision today to treat with OMT was based on Physical Exam  After verbal consent patient was treated with HVLa techniques in Sacral areas  Patient tolerated the procedure well with improvement in symptoms  Patient given exercises, stretches and lifestyle modifications  See medications in patient instructions if given  Patient will follow up in 2 weeks

## 2013-11-27 NOTE — Progress Notes (Signed)
  Sarah Riley D.O. [facility] [address] [place], [place] [address] Phone: [phone] Subjective:      CC: Back pain  EXB:[id number]:Subjective Sarah ShinKiesha G Riley is a 43 y.o. female coming in with complaint of back pain. Patient feels like she is pulled something in her back. Patient states last week she was attempting to of something in her car and had a pop that initially had pain was medially in the lower back. Patient states since then the pain seems to be unrelenting. Patient describes as more of a dull aching sensation that hurts with rest as well as even worse with movement. Patient states it is almost impossible for her to get comfortable. Patient denies any radiation down the leg or any numbness or weakness of the lower extremity. Patient denies any fevers or chills or any abnormal weight loss. Patient also denies any bowel or bladder incontinence. Patient has been taking Aleve which has been somewhat helpful. No history of back problems previously. Patient rates the severity today at 6/10 but has been as bad as 10 out of 10 previously.     Past medical history, social, surgical and family history all reviewed in electronic medical record.   Review of Systems: No headache, visual changes, nausea, vomiting, diarrhea, constipation, dizziness, abdominal pain, skin rash, fevers, chills, night sweats, weight loss, swollen lymph nodes, body aches, joint swelling, muscle aches, chest pain, shortness of breath, mood changes.   Objective Blood pressure 98/70, pulse 74, SpO2 98.00%.  General: No apparent distress alert and oriented x3 mood and affect normal, dressed appropriately.  HEENT: Pupils equal, extraocular movements intact  Respiratory: Patient's speak in full sentences and does not appear short of breath  Cardiovascular: No lower extremity edema, non tender, no erythema  Skin: Warm dry intact with no signs of infection or rash on extremities or on axial skeleton.  Abdomen: Soft  nontender  Neuro: Cranial nerves II through XII are intact, neurovascularly intact in all extremities with 2+ DTRs and 2+ pulses.  Lymph: No lymphadenopathy of posterior or anterior cervical chain or axillae bilaterally.  Gait normal with good balance and coordination.  MSK:  Non tender with full range of motion and good stability and symmetric strength and tone of shoulders, elbows, wrist, hip, knee and ankles bilaterally.  Back Exam:  Inspection: Unremarkable  Motion: Flexion 45 deg, Extension 45 deg, Side Bending to 45 deg bilaterally,  Rotation to 45 deg bilaterally  SLR laying: Negative  XSLR laying: Negative  Palpable tenderness: Tenderness to palpation over the right SI joint. FABER: Positive on right. Sensory change: Gross sensation intact to all lumbar and sacral dermatomes.  Reflexes: 2+ at both patellar tendons, 2+ at achilles tendons, Babinski's downgoing.  Strength at foot  Plantar-flexion: 5/5 Dorsi-flexion: 5/5 Eversion: 5/5 Inversion: 5/5  Leg strength  Quad: 5/5 Hamstring: 5/5 Hip flexor: 5/5 Hip abductors: 4/5  Gait unremarkable but cautious.   Osteopathic findings Sacrum rotated left on left    Impression and Recommendations:     This case required medical decision making of moderate complexity.

## 2013-12-11 ENCOUNTER — Ambulatory Visit: Payer: BC Managed Care – PPO | Admitting: Family Medicine

## 2014-08-09 ENCOUNTER — Ambulatory Visit (INDEPENDENT_AMBULATORY_CARE_PROVIDER_SITE_OTHER): Payer: BC Managed Care – PPO | Admitting: Obstetrics and Gynecology

## 2014-08-09 ENCOUNTER — Encounter: Payer: Self-pay | Admitting: Obstetrics and Gynecology

## 2014-08-09 VITALS — BP 120/60 | HR 76 | Resp 14 | Ht 62.0 in | Wt 156.8 lb

## 2014-08-09 DIAGNOSIS — Z30431 Encounter for routine checking of intrauterine contraceptive device: Secondary | ICD-10-CM

## 2014-08-09 DIAGNOSIS — R319 Hematuria, unspecified: Secondary | ICD-10-CM

## 2014-08-09 DIAGNOSIS — Z0000 Encounter for general adult medical examination without abnormal findings: Secondary | ICD-10-CM

## 2014-08-09 DIAGNOSIS — Z01419 Encounter for gynecological examination (general) (routine) without abnormal findings: Secondary | ICD-10-CM

## 2014-08-09 LAB — POCT URINALYSIS DIPSTICK
Bilirubin, UA: NEGATIVE
Glucose, UA: NEGATIVE
KETONES UA: NEGATIVE
Leukocytes, UA: NEGATIVE
Nitrite, UA: NEGATIVE
PH UA: 5
PROTEIN UA: NEGATIVE
Urobilinogen, UA: 1

## 2014-08-09 NOTE — Patient Instructions (Signed)

## 2014-08-09 NOTE — Progress Notes (Signed)
43 y.o. G2P2002 Married African AmericanF here for annual exam.   Mirena IUD place in 2012.  No menses.   Weight gain.   Some vaginal dryness.   Some joint pains.   Patient's last menstrual period was 09/06/2005 (approximate).          Sexually active: Yes.    The current method of family planning is IUD.    Exercising: No.  The patient does not participate in regular exercise at present. 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  Had colposcopy with biopsy and no treatment.  MMG:  Never had one Colonoscopy:  2012-Polyps f/u in 2017  BMD:   Never had one TDaP:  Within 10 years  Screening Labs: yes , Urine today: Trace RBC's - No dysuria.  Some frequency. Last UTI was a while ago.     reports that she has never smoked. She does not have any smokeless tobacco history on file. She reports that she does not drink alcohol or use illicit drugs.  Past Medical History  Diagnosis Date  . ANEMIA-NOS 04/30/2008  . ANEMIA-IRON DEFICIENCY 04/30/2008  . ALLERGIC RHINITIS 10/18/2008  . COLONIC POLYPS, HX OF 04/30/2008    Past Surgical History  Procedure Laterality Date  . Cesarean section  x2    Current Outpatient Prescriptions  Medication Sig Dispense Refill  . Pseudoephedrine-Ibuprofen 30-200 MG TABS Take by mouth as needed. Advil Cold and Sinus     No current facility-administered medications for this visit.    Family History  Problem Relation Age of Onset  . Hypertension Mother   . Clotting disorder Father     ROS:  Pertinent items are noted in HPI.  Otherwise, a comprehensive ROS was negative.  Exam:   BP 120/60 mmHg  Pulse 76  Resp 14  Ht 5\' 2"  (1.575 m)  Wt 156 lb 12.8 oz (71.124 kg)  BMI 28.67 kg/m2  LMP 09/06/2005 (Approximate)     Height: 5\' 2"  (157.5 cm)  Ht Readings from Last 3 Encounters:  08/09/14 5\' 2"  (1.575 m)  02/10/12 5\' 2"  (1.575 m)  08/09/11 5\' 2"  (1.575 m)    General appearance: alert,  cooperative and appears stated age Head: Normocephalic, without obvious abnormality, atraumatic Neck: no adenopathy, supple, symmetrical, trachea midline and thyroid normal to inspection and palpation Lungs: clear to auscultation bilaterally Breasts: normal appearance, no masses or tenderness, Inspection negative, No nipple retraction or dimpling, No nipple discharge or bleeding, No axillary or supraclavicular adenopathy Heart: regular rate and rhythm Abdomen: soft, non-tender; bowel sounds normal; no masses,  no organomegaly Extremities: extremities normal, atraumatic, no cyanosis or edema Skin: Skin color, texture, turgor normal. No rashes or lesions Lymph nodes: Cervical, supraclavicular, and axillary nodes normal. No abnormal inguinal nodes palpated Neurologic: Grossly normal   Pelvic: External genitalia:  no lesions              Urethra:  normal appearing urethra with no masses, tenderness or lesions              Bartholins and Skenes: normal                 Vagina: normal appearing vagina with normal color and discharge, no lesions              Cervix: no lesions              Pap taken: Yes.   Bimanual Exam:  Uterus:  normal size, contour, position, consistency, mobility, non-tender              Adnexa: normal adnexa and no mass, fullness, tenderness               Rectovaginal: Confirms               Anus:  normal sphincter tone, no lesions  A:  Well Woman with normal exam Mirena IUD patient.  Strings not seen.  History of LGSIL.  Weight gain.  Atrophic vaginal changes.  Microscopic hematuria.   P:   Mammogram recommended.  [facility] pap smear and HR HPV testing.  Labs to be drawn at future visit.  Orders placed for lipid profile, CMP, CBC, TSH.  Return for ultrasound for IUD location.  Discussed increased exercise.  Try cooking oils for vaginal dryness or vitamin E vaginal suppositories. Urine micro and culture.   return annually or  prn  An After Visit Summary was printed and given to the patient.

## 2014-08-10 LAB — URINALYSIS, MICROSCOPIC ONLY
CASTS: NONE SEEN
CRYSTALS: NONE SEEN

## 2014-08-11 ENCOUNTER — Other Ambulatory Visit: Payer: Self-pay | Admitting: Obstetrics and Gynecology

## 2014-08-11 LAB — URINE CULTURE

## 2014-08-11 MED ORDER — AMOXICILLIN 500 MG PO CAPS: 500.0000 mg | ORAL_CAPSULE | Freq: Three times a day (TID) | ORAL | Status: DC

## 2014-08-11 MED ORDER — FLUCONAZOLE 150 MG PO TABS: 150.0000 mg | ORAL_TABLET | Freq: Once | ORAL | Status: DC

## 2014-08-12 ENCOUNTER — Telehealth: Payer: Self-pay

## 2014-08-12 NOTE — Telephone Encounter (Signed)
Entered in error

## 2014-08-12 NOTE — Telephone Encounter (Signed)
Called patient to discuss results of urine culture at Cell # [phone] .  Unable to leave message "mailbox full".  Called home [phone] and phone rings and rings--no answer.

## 2014-08-12 NOTE — Telephone Encounter (Signed)
-----   Message from Brook E Amundson de Carvalho E Silva, MD sent at 08/11/2014  9:59 AM EST ----- Please contact patient with urine culture results. I am sending an Rx for amoxicillin 500 mg po q 8 hours for 7 days to her pharmacy.  I am also sending an Rx for Dilflucan 150 mg po x 1 for any itching and burning.  Will give an extra tablet to repeat dose if symptoms persist. 

## 2014-08-13 LAB — IPS PAP TEST WITH HPV

## 2014-08-14 ENCOUNTER — Telehealth: Payer: Self-pay

## 2014-08-14 ENCOUNTER — Telehealth: Payer: Self-pay | Admitting: Obstetrics and Gynecology

## 2014-08-14 DIAGNOSIS — Z975 Presence of (intrauterine) contraceptive device: Secondary | ICD-10-CM

## 2014-08-14 NOTE — Telephone Encounter (Signed)
Called patient to discuss results of urine culture.  Called [phone] unable to LMOVM "mailbox full".  Called HM# [phone] and it sounds like someone picks up and immediately hangs up the phone (called x3).

## 2014-08-14 NOTE — Telephone Encounter (Signed)
Call to patient. Need to go over benefits and schedule PUS. Patient mailbox is full, cannot leave message.

## 2014-08-14 NOTE — Telephone Encounter (Signed)
-----   Message from MountainaireBrook E Amundson de Gwenevere Ghaziarvalho E Silva, MD sent at 08/11/2014  9:59 AM EST ----- Please contact patient with urine culture results. I am sending an Rx for amoxicillin 500 mg po q 8 hours for 7 days to her pharmacy.  I am also sending an Rx for Dilflucan 150 mg po x 1 for any itching and burning.  Will give an extra tablet to repeat dose if symptoms persist.

## 2014-08-15 ENCOUNTER — Telehealth: Payer: Self-pay

## 2014-08-15 NOTE — Telephone Encounter (Signed)
See result note dated 08-15-14.

## 2014-08-15 NOTE — Telephone Encounter (Signed)
Called patient's mother, Barbee ShropshireKathryn Graves, at [phone] and asked her to have patient call me since I have not been able To leave message on patient's cell # (mailbox full) and HM # Rings and rings.  She will try to contact patient and have her call me.

## 2014-08-15 NOTE — Telephone Encounter (Signed)
See phone note dated 08-15-14.

## 2014-08-15 NOTE — Telephone Encounter (Signed)
-----   Message from Brook E Amundson de Carvalho E Silva, MD sent at 08/11/2014  9:59 AM EST ----- Please contact patient with urine culture results. I am sending an Rx for amoxicillin 500 mg po q 8 hours for 7 days to her pharmacy.  I am also sending an Rx for Dilflucan 150 mg po x 1 for any itching and burning.  Will give an extra tablet to repeat dose if symptoms persist. 

## 2014-08-15 NOTE — Telephone Encounter (Signed)
Spoke with patient. Advised that per benefit quote received, she will be responsible to pay $462.38 when she comes in for PUS.  Patient states that she cannot afford this procedure right now, but probably early next year.

## 2014-08-15 NOTE — Telephone Encounter (Signed)
See phone note dated 08-15-14. 

## 2014-08-15 NOTE — Telephone Encounter (Signed)
Kennon RoundsSally,  I recommend that the patient proceed with the ultrasound at the [facility].  She may ask them for a payment plan.  We need to understand where the IUD is located - in the uterus, in the abdominal cavity?  Cc- Cathrine MusterSabrina Franklin

## 2014-08-16 NOTE — Telephone Encounter (Signed)
Call to patient to discuss recommendation to proceed with pelvic ultrasound to identify location of IUD. Offered option of scheduling at  [facility] where payment plan available and patient is agreeable. Discussed scheduling preferences, prefers next Friday, December 18.  Advised will need office visit with Dr. Edward JollySilva two days following ultrasound for results so recommend proceeding earlier in the week if possible due to holidays the following week. Patient is agreeable. I will schedule and call her back.  Call to hospital radiology, [facility], appointment Wednesday, December 16, at 3 PM. Order entered.  Call back to patient. [facility] and follow up appointment scheduled with Dr. Edward JollySilva on December 18.  Marland Kitchen.Routing to provider for final review. Patient agreeable to disposition. Will close encounter

## 2014-08-21 ENCOUNTER — Other Ambulatory Visit: Payer: Self-pay | Admitting: Obstetrics and Gynecology

## 2014-08-21 ENCOUNTER — Ambulatory Visit (HOSPITAL_COMMUNITY)
Admission: RE | Admit: 2014-08-21 | Discharge: 2014-08-21 | Disposition: A | Discharge status: Home / Self Care | Payer: BC Managed Care – PPO | Source: Ambulatory Visit | Attending: Obstetrics and Gynecology | Admitting: Obstetrics and Gynecology

## 2014-08-21 DIAGNOSIS — Z30431 Encounter for routine checking of intrauterine contraceptive device: Secondary | ICD-10-CM | POA: Insufficient documentation

## 2014-08-21 DIAGNOSIS — Z1231 Encounter for screening mammogram for malignant neoplasm of breast: Secondary | ICD-10-CM

## 2014-08-21 DIAGNOSIS — N854 Malposition of uterus: Secondary | ICD-10-CM | POA: Diagnosis not present

## 2014-08-21 DIAGNOSIS — Z975 Presence of (intrauterine) contraceptive device: Secondary | ICD-10-CM

## 2014-08-23 ENCOUNTER — Encounter: Payer: Self-pay | Admitting: Obstetrics and Gynecology

## 2014-08-23 ENCOUNTER — Ambulatory Visit (INDEPENDENT_AMBULATORY_CARE_PROVIDER_SITE_OTHER): Payer: BC Managed Care – PPO | Admitting: Obstetrics and Gynecology

## 2014-08-23 VITALS — BP 104/66 | HR 68 | Resp 12 | Ht 62.0 in | Wt 158.0 lb

## 2014-08-23 DIAGNOSIS — Z30431 Encounter for routine checking of intrauterine contraceptive device: Secondary | ICD-10-CM

## 2014-08-23 DIAGNOSIS — R319 Hematuria, unspecified: Secondary | ICD-10-CM

## 2014-08-23 DIAGNOSIS — N390 Urinary tract infection, site not specified: Secondary | ICD-10-CM

## 2014-08-23 NOTE — Progress Notes (Signed)
Patient ID: Sarah Riley, female   DOB: 1971-03-07, 43 y.o.   MRN: [id number]  GYNECOLOGY  VISIT   HPI: 43 y.o.   Married  [place]  [place]   [phone] with Patient's last menstrual period was 09/06/2005 (approximate).   here for  Follow up of pelvic ultrasound and urine recheck.  [facility] for IUD localization.  Strings were not seen on pelvic exam.  IUD in endometrial canal.  Normal uterus. Right ovary normal.  Left ovary not seen.   Recent treatment for Strep Viridans UTI which was asymptomatic.  Urine has less odor now.   Pap at annual exam showed ASCUS and negative HR HPV.   GYNECOLOGIC HISTORY: Patient's last menstrual period was 09/06/2005 (approximate).          OB History    Gravida Para Term Preterm AB TAB SAB Ectopic Multiple Living   2 2 2       2          Patient Active Problem List   Diagnosis Date Noted  . SI (sacroiliac) joint dysfunction 11/27/2013  . Nonallopathic lesion of sacral region 11/27/2013  . Cluster headache 02/10/2012  . Preventative health care 08/09/2011  . ALLERGIC RHINITIS 10/18/2008  . FATIGUE 10/18/2008  . ANEMIA-IRON DEFICIENCY 04/30/2008  . COLONIC POLYPS, HX OF 04/30/2008    Past Medical History  Diagnosis Date  . ANEMIA-NOS 04/30/2008  . ANEMIA-IRON DEFICIENCY 04/30/2008  . ALLERGIC RHINITIS 10/18/2008  . COLONIC POLYPS, HX OF 04/30/2008    Past Surgical History  Procedure Laterality Date  . Cesarean section  x2  . Intrauterine device insertion  2010    Mirena     Current Outpatient Prescriptions  Medication Sig Dispense Refill  . fluconazole (DIFLUCAN) 150 MG tablet Take 1 tablet (150 mg total) by mouth once. Take one tablet.  Repeat in 48 hours if symptoms are not completely resolved. (Patient not taking: Reported on 08/23/2014) 2 tablet 0  . Pseudoephedrine-Ibuprofen 30-200 MG TABS Take by mouth as needed. Advil Cold and Sinus     No current facility-administered medications for this  visit.     ALLERGIES: Oxycodone  Family History  Problem Relation Age of Onset  . Hypertension Mother   . Clotting disorder Father     History   Social History  . Marital Status: Married    Spouse Name: N/A    Number of Children: N/A  . Years of Education: N/A   Occupational History  . Not on file.   Social History Main Topics  . Smoking status: Never Smoker   . Smokeless tobacco: Not on file  . Alcohol Use: No  . Drug Use: No  . Sexual Activity:    Partners: Male    Birth Control/ Protection: IUD   Other Topics Concern  . Not on file   Social History Narrative    ROS:  Pertinent items are noted in HPI.  PHYSICAL EXAMINATION:    BP 104/66 mmHg  Pulse 68  Resp 12  Ht 5\' 2"  (1.575 m)  Wt 158 lb (71.668 kg)  BMI 28.89 kg/m2  LMP 09/06/2005 (Approximate)      ASSESSMENT  Mirena IUD patient.  Recent UTI. ASCUS pap and negative HR HPV.   PLAN  Reassurance regarding IUD.  Discussed methods of office removal of IUD in future if needed. UC test of cure today.  Pap in one year.   Return prn.   An After Visit Summary was printed and  given to the patient.  ___10___ minutes face to face time of which over 50% was spent in counseling.

## 2014-08-26 ENCOUNTER — Telehealth: Payer: Self-pay | Admitting: Obstetrics and Gynecology

## 2014-08-26 ENCOUNTER — Ambulatory Visit: Payer: BC Managed Care – PPO

## 2014-08-26 NOTE — Telephone Encounter (Signed)
Patient dnka her urine culture appointment today. I left a message for patient to call and reschedule. Patient was last seen 08/23/14 for urine culture, was this supposed  to be canceled?

## 2014-08-26 NOTE — Telephone Encounter (Signed)
This was an old appointment.  Patient was seen 08-23-14 and we did a TOC urine at that appointment.  Patient should not be charged for missing this appointment.

## 2014-08-27 ENCOUNTER — Telehealth: Payer: Self-pay

## 2014-08-27 NOTE — Telephone Encounter (Signed)
LMTCB concerning Urine Culture

## 2014-08-28 NOTE — Telephone Encounter (Signed)
S/W pt and she will come in on 09/02/14 to leave a urine sample

## 2014-09-02 ENCOUNTER — Ambulatory Visit: Payer: Self-pay

## 2014-09-02 ENCOUNTER — Ambulatory Visit (HOSPITAL_COMMUNITY)
Admission: RE | Admit: 2014-09-02 | Discharge: 2014-09-02 | Disposition: A | Discharge status: Home / Self Care | Payer: BC Managed Care – PPO | Source: Ambulatory Visit | Attending: Obstetrics and Gynecology | Admitting: Obstetrics and Gynecology

## 2014-09-02 DIAGNOSIS — R928 Other abnormal and inconclusive findings on diagnostic imaging of breast: Secondary | ICD-10-CM | POA: Diagnosis not present

## 2014-09-02 DIAGNOSIS — Z1231 Encounter for screening mammogram for malignant neoplasm of breast: Secondary | ICD-10-CM | POA: Diagnosis present

## 2014-09-04 ENCOUNTER — Telehealth: Payer: Self-pay

## 2014-09-04 ENCOUNTER — Other Ambulatory Visit: Payer: Self-pay | Admitting: Obstetrics and Gynecology

## 2014-09-04 DIAGNOSIS — R928 Other abnormal and inconclusive findings on diagnostic imaging of breast: Secondary | ICD-10-CM

## 2014-09-04 NOTE — Telephone Encounter (Signed)
Called patient at [phone] to remind her she needs a Urine TOC.  Patient states she forgot to come by on 09-02-14.  She will try to come by tomorrow for repeat Urine culture.

## 2014-09-05 ENCOUNTER — Ambulatory Visit (INDEPENDENT_AMBULATORY_CARE_PROVIDER_SITE_OTHER): Payer: BC Managed Care – PPO

## 2014-09-05 DIAGNOSIS — N390 Urinary tract infection, site not specified: Secondary | ICD-10-CM

## 2014-09-05 DIAGNOSIS — R319 Hematuria, unspecified: Secondary | ICD-10-CM

## 2014-09-07 LAB — URINE CULTURE

## 2014-09-20 ENCOUNTER — Other Ambulatory Visit: Payer: Self-pay

## 2014-09-30 ENCOUNTER — Other Ambulatory Visit: Payer: Self-pay

## 2014-10-11 ENCOUNTER — Other Ambulatory Visit: Payer: Self-pay

## 2014-10-22 ENCOUNTER — Telehealth: Payer: Self-pay | Admitting: Emergency Medicine

## 2014-10-22 NOTE — Telephone Encounter (Signed)
Outgoing call to patient to find out if needs assistance with scheduling follow up diagnostic [facility]. Patient states "I completely forgot I needed to do this." Patient accepts appointment and I advised I would call her right back with date and time. She is agreeable.   Returned call to patient. She is scheduled for 10/29/14 at 1245 for dx imaging.   Routing to provider for final review. Patient agreeable to disposition. Will close encounter

## 2014-10-29 ENCOUNTER — Ambulatory Visit
Admission: RE | Admit: 2014-10-29 | Discharge: 2014-10-29 | Disposition: A | Discharge status: Home / Self Care | Payer: BLUE CROSS/BLUE SHIELD | Source: Ambulatory Visit | Attending: Obstetrics and Gynecology | Admitting: Obstetrics and Gynecology

## 2014-10-29 DIAGNOSIS — R928 Other abnormal and inconclusive findings on diagnostic imaging of breast: Secondary | ICD-10-CM

## 2015-08-15 ENCOUNTER — Ambulatory Visit: Payer: Self-pay | Admitting: Obstetrics and Gynecology

## 2015-08-15 ENCOUNTER — Ambulatory Visit: Payer: BC Managed Care – PPO | Admitting: Obstetrics and Gynecology

## 2015-09-10 ENCOUNTER — Encounter: Payer: Self-pay | Admitting: Certified Nurse Midwife

## 2015-09-10 ENCOUNTER — Ambulatory Visit (INDEPENDENT_AMBULATORY_CARE_PROVIDER_SITE_OTHER): Payer: BLUE CROSS/BLUE SHIELD | Admitting: Certified Nurse Midwife

## 2015-09-10 VITALS — BP 108/62 | HR 68 | Temp 97.4°F | Resp 16 | Ht 62.0 in | Wt 150.0 lb

## 2015-09-10 DIAGNOSIS — N390 Urinary tract infection, site not specified: Secondary | ICD-10-CM | POA: Diagnosis not present

## 2015-09-10 DIAGNOSIS — N898 Other specified noninflammatory disorders of vagina: Secondary | ICD-10-CM

## 2015-09-10 DIAGNOSIS — R319 Hematuria, unspecified: Secondary | ICD-10-CM

## 2015-09-10 LAB — POCT URINALYSIS DIPSTICK
BILIRUBIN UA: NEGATIVE
Glucose, UA: NEGATIVE
NITRITE UA: NEGATIVE
PH UA: 5
Protein, UA: NEGATIVE
Urobilinogen, UA: NEGATIVE

## 2015-09-10 NOTE — Progress Notes (Signed)
Reviewed personally.  M. Suzanne Kaiulani Sitton, MD.  

## 2015-09-10 NOTE — Progress Notes (Signed)
45 yo african [place] married  Female [id number]  here with complaint of UTI, with onset  About  2 weeks. Patient complaining of urinary frequency and pain with urination only when touches skin.. Patient denies fever, chills, nausea or back pain. No new personal products. Patient feels maybe related to sexual activity. Denies any vaginal symptoms.    Contraception is IUD .  Some vaginal dryness with sexual activity.. Patient is not drinking adequate water intake.Drinking only Gingerale. No other health issues today.   O: Healthy female WDWN Affect: Normal, orientation x 3 Skin : warm and dry CVAT: negative bilateral Abdomen: negative for suprapubic tenderness  Pelvic exam: External genital area: normal, no lesions Bladder,Urethra non  tender, Urethral meatus: slight tenderness, no redness Vagina:white non odorous vaginal discharge, normal appearance affirm taken Cervix: normal, non tender Uterus:normal,non tender Adnexa: normal non tender, no fullness or masses   A: Normal pelvic exam R/O UTI R/O vaginal infection Vaginal dryness  P: Reviewed findings of normal pelvic exam and need to send urine for Micro to R/O UTI. Instructed to increase water intake using lemon or orange additive. Decrease soda use. Start on OTC Azo to see if this helps with symptoms until lab back. Patient agreeable, not sure if she has UTI. Discussed will treat per affirm results if positive. Discussed vaginal dryness and increase risk of UTI and vaginal infection. Suggested trial of OTC pure coconut oil for dryness and sexual activity. Patient likes this idea and will try.  Patient will be advised of labs and treat if indicated. WUJ:WJXBJLab:Urine micro, culture Reviewed warning signs and symptoms of UTI and need to advise if occurring. Encouraged to limit soda, tea, and coffee and be sure to increase water intake.   RV prn

## 2015-09-10 NOTE — Patient Instructions (Addendum)
Monilial Vaginitis Vaginitis in a soreness, swelling and redness (inflammation) of the vagina and vulva. Monilial vaginitis is not a sexually transmitted infection. CAUSES  Yeast vaginitis is caused by yeast (candida) that is normally found in your vagina. With a yeast infection, the candida has overgrown in number to a point that upsets the chemical balance. SYMPTOMS   [place], thick vaginal discharge.  Swelling, itching, redness and irritation of the vagina and possibly the lips of the vagina (vulva).  Burning or painful urination.  Painful intercourse. DIAGNOSIS  Things that may contribute to monilial vaginitis are:  Postmenopausal and virginal states.  Pregnancy.  Infections.  Being tired, sick or stressed, especially if you had monilial vaginitis in the past.  Diabetes. Good control will help lower the chance.  Birth control pills.  Tight fitting garments.  Using bubble bath, feminine sprays, douches or deodorant tampons.  Taking certain medications that kill germs (antibiotics).  Sporadic recurrence can occur if you become ill. TREATMENT  Your caregiver will give you medication.  There are several kinds of anti monilial vaginal creams and suppositories specific for monilial vaginitis. For recurrent yeast infections, use a suppository or cream in the vagina 2 times a week, or as directed.  Anti-monilial or steroid cream for the itching or irritation of the vulva may also be used. Get your caregiver's permission.  Painting the vagina with methylene blue solution may help if the monilial cream does not work.  Eating yogurt may help prevent monilial vaginitis. HOME CARE INSTRUCTIONS   Finish all medication as prescribed.  Do not have sex until treatment is completed or after your caregiver tells you it is okay.  Take warm sitz baths.  Do not douche.  Do not use tampons, especially scented ones.  Wear cotton underwear.  Avoid tight pants and panty  hose.  Tell your sexual partner that you have a yeast infection. They should go to their caregiver if they have symptoms such as mild rash or itching.  Your sexual partner should be treated as well if your infection is difficult to eliminate.  Practice safer sex. Use condoms.  Some vaginal medications cause latex condoms to fail. Vaginal medications that harm condoms are:  Cleocin cream.  Butoconazole (Femstat).  Terconazole (Terazol) vaginal suppository.  Miconazole (Monistat) (may be purchased over the counter). SEEK MEDICAL CARE IF:   You have a temperature by mouth above 102 F (38.9 C).  The infection is getting worse after 2 days of treatment.  The infection is not getting better after 3 days of treatment.  You develop blisters in or around your vagina.  You develop vaginal bleeding, and it is not your menstrual period.  You have pain when you urinate.  You develop intestinal problems.  You have pain with sexual intercourse.   This information is not intended to replace advice given to you by your health care provider. Make sure you discuss any questions you have with your health care provider.   Document Released: 06/02/2005 Document Revised: 11/15/2011 Document Reviewed: 02/24/2015 Elsevier Interactive Patient Education 2016 Elsevier Inc. Urinary Tract Infection Urinary tract infections (UTIs) can develop anywhere along your urinary tract. Your urinary tract is your body's drainage system for removing wastes and extra water. Your urinary tract includes two kidneys, two ureters, a bladder, and a urethra. Your kidneys are a pair of bean-shaped organs. Each kidney is about the size of your fist. They are located below your ribs, one on each side of your spine. CAUSES Infections are  caused by microbes, which are microscopic organisms, including fungi, viruses, and bacteria. These organisms are so small that they can only be seen through a microscope. Bacteria are  the microbes that most commonly cause UTIs. SYMPTOMS  Symptoms of UTIs may vary by age and gender of the patient and by the location of the infection. Symptoms in young women typically include a frequent and intense urge to urinate and a painful, burning feeling in the bladder or urethra during urination. Older women and men are more likely to be tired, shaky, and weak and have muscle aches and abdominal pain. A fever may mean the infection is in your kidneys. Other symptoms of a kidney infection include pain in your back or sides below the ribs, nausea, and vomiting. DIAGNOSIS To diagnose a UTI, your caregiver will ask you about your symptoms. Your caregiver will also ask you to provide a urine sample. The urine sample will be tested for bacteria and white blood cells. White blood cells are made by your body to help fight infection. TREATMENT  Typically, UTIs can be treated with medication. Because most UTIs are caused by a bacterial infection, they usually can be treated with the use of antibiotics. The choice of antibiotic and length of treatment depend on your symptoms and the type of bacteria causing your infection. HOME CARE INSTRUCTIONS  If you were prescribed antibiotics, take them exactly as your caregiver instructs you. Finish the medication even if you feel better after you have only taken some of the medication.  Drink enough water and fluids to keep your urine clear or pale yellow.  Avoid caffeine, tea, and carbonated beverages. They tend to irritate your bladder.  Empty your bladder often. Avoid holding urine for long periods of time.  Empty your bladder before and after sexual intercourse.  After a bowel movement, women should cleanse from front to back. Use each tissue only once. SEEK MEDICAL CARE IF:   You have back pain.  You develop a fever.  Your symptoms do not begin to resolve within 3 days. SEEK IMMEDIATE MEDICAL CARE IF:   You have severe back pain or lower  abdominal pain.  You develop chills.  You have nausea or vomiting.  You have continued burning or discomfort with urination. MAKE SURE YOU:   Understand these instructions.  Will watch your condition.  Will get help right away if you are not doing well or get worse.   This information is not intended to replace advice given to you by your health care provider. Make sure you discuss any questions you have with your health care provider.   Document Released: 06/02/2005 Document Revised: 05/14/2015 Document Reviewed: 10/01/2011 Elsevier Interactive Patient Education 2016 [employer]   AZO for Urinary health. Take up to 3 times daily.

## 2015-09-11 ENCOUNTER — Other Ambulatory Visit: Payer: Self-pay | Admitting: Certified Nurse Midwife

## 2015-09-11 DIAGNOSIS — N390 Urinary tract infection, site not specified: Secondary | ICD-10-CM

## 2015-09-11 LAB — URINALYSIS, MICROSCOPIC ONLY
CASTS: NONE SEEN [LPF]
CRYSTALS: NONE SEEN [HPF]
Yeast: NONE SEEN [HPF]

## 2015-09-11 LAB — WET PREP BY MOLECULAR PROBE
Candida species: NEGATIVE
Gardnerella vaginalis: NEGATIVE
TRICHOMONAS VAG: NEGATIVE

## 2015-09-11 MED ORDER — NITROFURANTOIN MONOHYD MACRO 100 MG PO CAPS: 100.0000 mg | ORAL_CAPSULE | Freq: Two times a day (BID) | ORAL | Status: DC

## 2015-09-12 LAB — URINE CULTURE: Colony Count: >=100000

## 2015-10-16 IMAGING — MG MM DIGITAL SCREENING BILAT
5 series · 5 of 5 positions shown · non-contrast
Comparison: None.

CLINICAL DATA: Screening.

EXAM:
DIGITAL SCREENING BILATERAL MAMMOGRAM WITH CAD

[R CC]
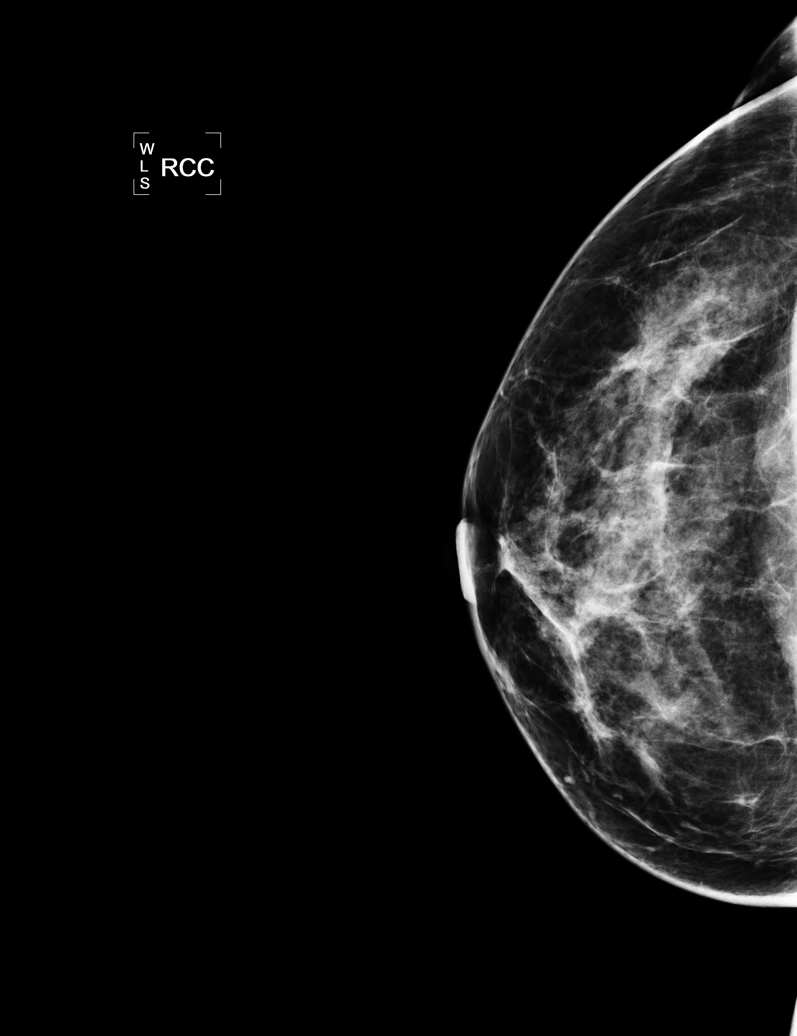

[R MLO (1 of 2)]
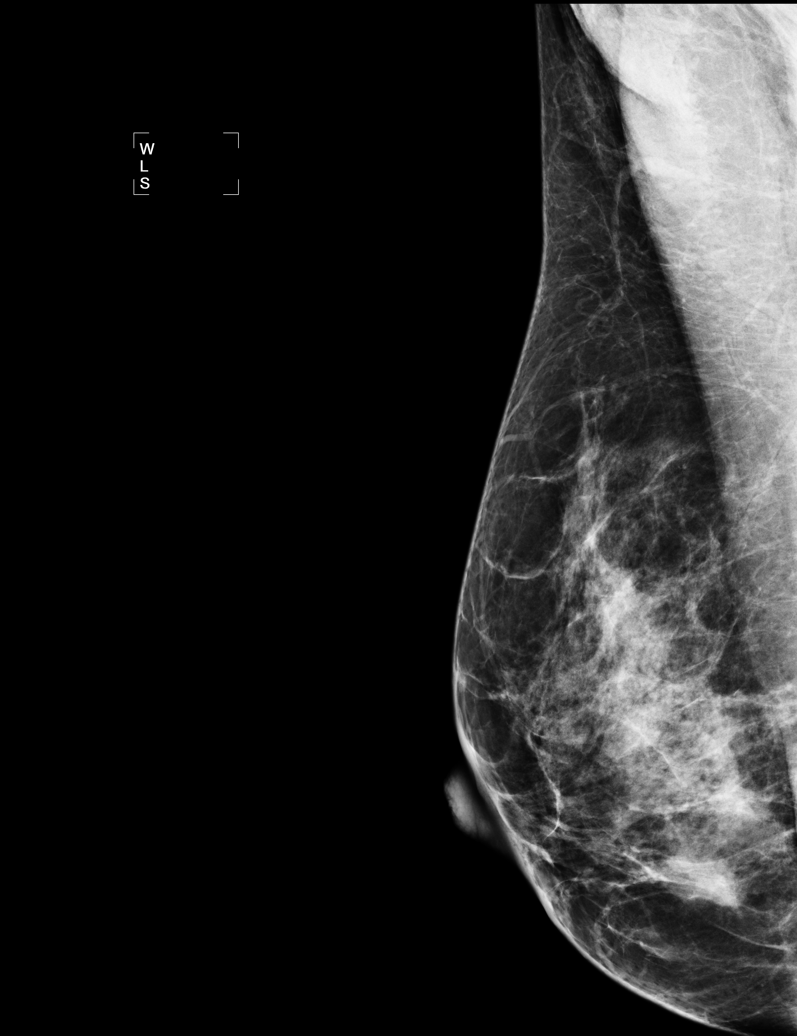

[L CC]
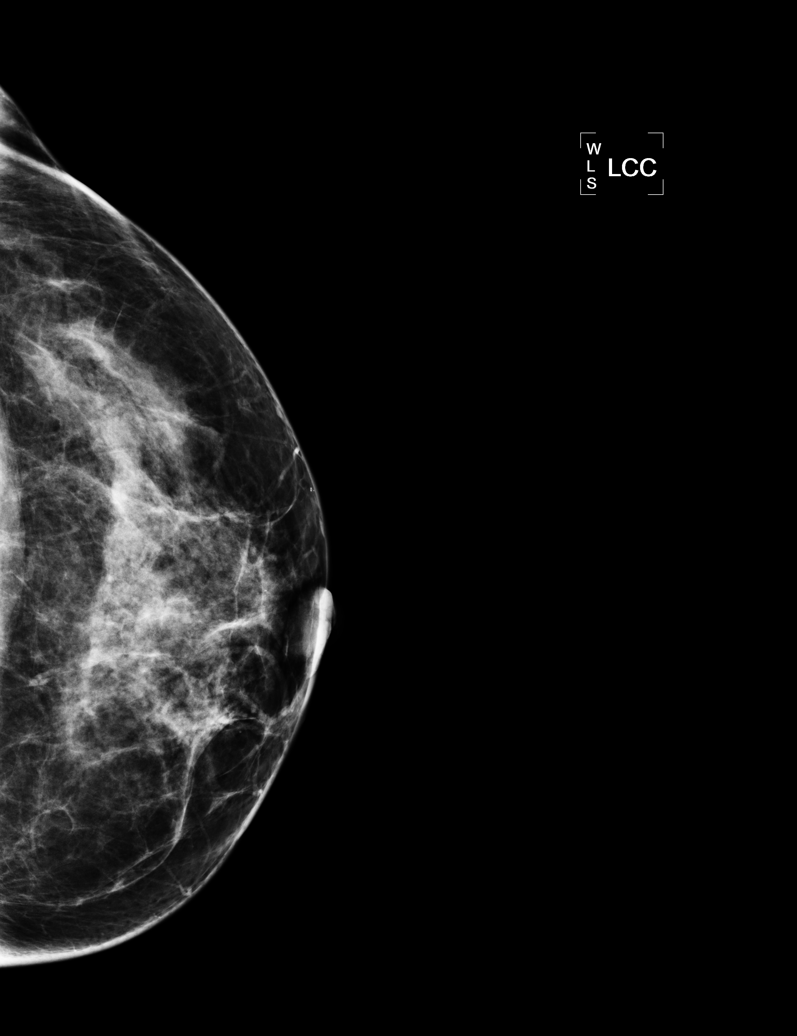

[L MLO]
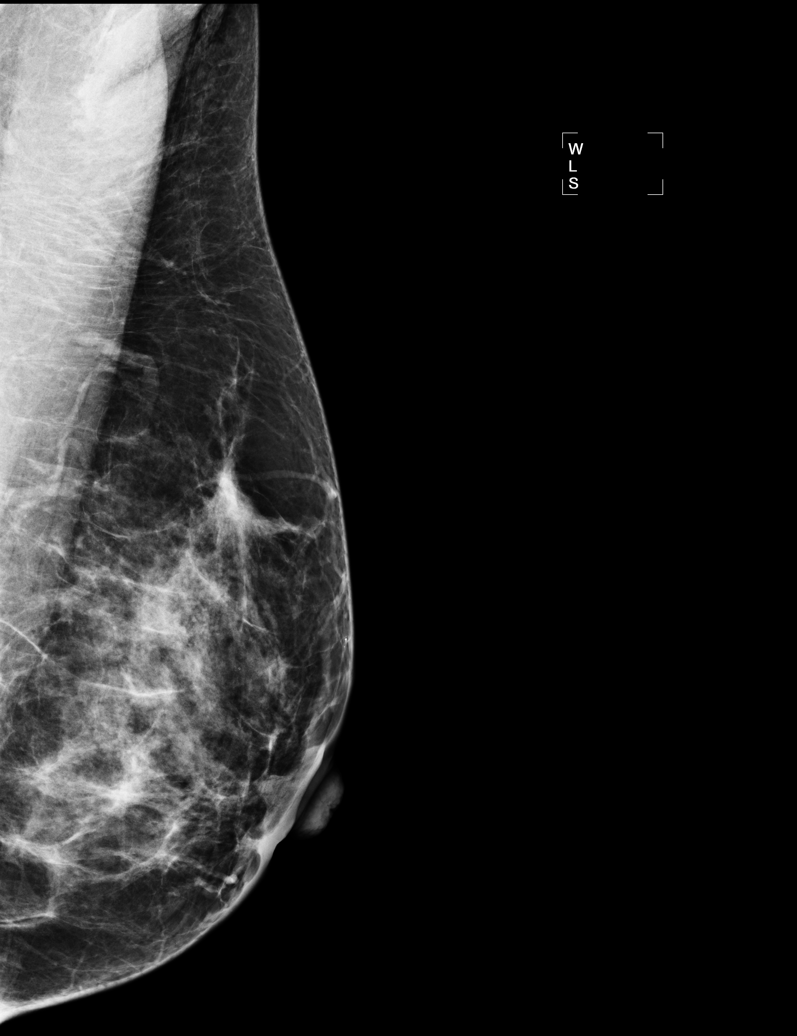

[R MLO (2 of 2)]
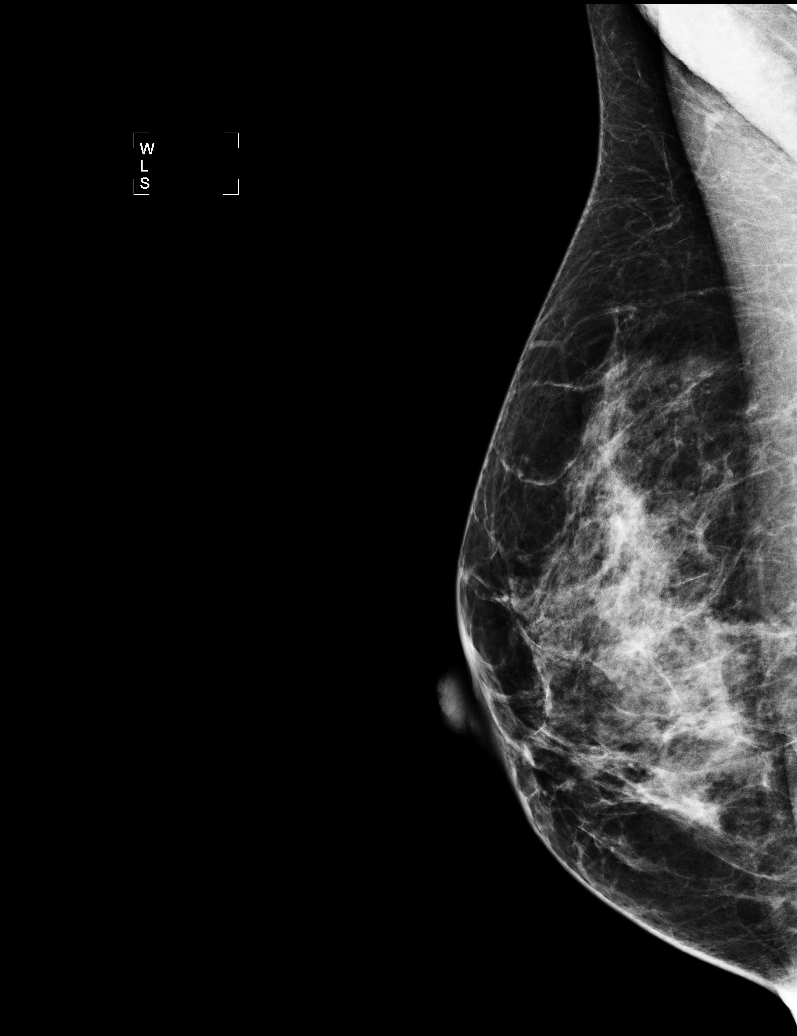

[5 of 5 positions shown; findings below may reference images not displayed]

Baseline exam.

ACR Breast Density Category c: The breast tissue is heterogeneously
dense, which may obscure small masses.
FINDINGS: In the left breast, a possible asymmetry warrants further evaluation
with spot compression views and possibly ultrasound. In the right
breast, no findings suspicious for malignancy. Images were processed
with CAD.
IMPRESSION: Further evaluation is suggested for possible asymmetry in the left
breast.

RECOMMENDATION:
Diagnostic mammogram and possibly ultrasound of the left breast.
(Code:IM-G-RRT)

The patient will be contacted regarding the findings, and additional
imaging will be scheduled.

BI-RADS CATEGORY  0: Incomplete. Need additional imaging evaluation
and/or prior mammograms for comparison.

## 2015-12-29 ENCOUNTER — Telehealth: Payer: Self-pay | Admitting: *Deleted

## 2015-12-29 NOTE — Telephone Encounter (Signed)
08 Pap recall due 08/2015 due to ASCUS on 08/2014 pap and remote history of CIN-I  Past History:   08/09/14 Pap, ASCUS with negative HR HPV 08/04/11 Colpo, Biopsy with HPV effect, ECC=Negative  05/31/11 Pap, LGSIL Previous pap smears seen in [facility] negative  Pt has not scheduled AEX with Dr. Edward JollySilva.  Please call pt to schedule AEX.  Thank you.

## 2016-01-08 NOTE — Telephone Encounter (Signed)
I spoke with the patient and scheduled her annual exam with Dr. Edward JollySilva on 02/23/16 at 1:30pm.    Recall extended to 02/2016.  Pt also wanted to discuss IUD replacement.  Sarah Riley thinks IUD is due to come out in 03/2016.  She will find card to let us know definite date.  Advised pt if IUD is expired she should not rely on IUD as a method of birth control.  She will need to use a back up method until new IUD inserted.  Pt voices understanding of these instructions.  Patient will discuss options at annual exam appointment unless IUD needs to be replaced sooner.  She will return call to let office know when IUD needs to be replaced.  Routing to provider for final review.  Closing encounter.

## 2016-02-23 ENCOUNTER — Ambulatory Visit: Payer: Self-pay | Admitting: Obstetrics and Gynecology

## 2016-02-23 ENCOUNTER — Telehealth: Payer: Self-pay | Admitting: Obstetrics and Gynecology

## 2016-02-23 NOTE — Telephone Encounter (Signed)
Thank you for the update. [facility] policy applies.  I have closed the encounter.

## 2016-02-23 NOTE — Telephone Encounter (Signed)
Patient canceled same day. She had to pick up her kids from camp. [facility] is aware of [facility].

## 2016-03-08 ENCOUNTER — Ambulatory Visit (INDEPENDENT_AMBULATORY_CARE_PROVIDER_SITE_OTHER): Payer: BLUE CROSS/BLUE SHIELD | Admitting: Obstetrics and Gynecology

## 2016-03-08 ENCOUNTER — Encounter: Payer: Self-pay | Admitting: Obstetrics and Gynecology

## 2016-03-08 ENCOUNTER — Telehealth: Payer: Self-pay | Admitting: Emergency Medicine

## 2016-03-08 VITALS — BP 100/62 | HR 80 | Resp 18 | Ht 62.0 in | Wt 154.0 lb

## 2016-03-08 DIAGNOSIS — Z3009 Encounter for other general counseling and advice on contraception: Secondary | ICD-10-CM | POA: Diagnosis not present

## 2016-03-08 DIAGNOSIS — Z01419 Encounter for gynecological examination (general) (routine) without abnormal findings: Secondary | ICD-10-CM | POA: Diagnosis not present

## 2016-03-08 DIAGNOSIS — E041 Nontoxic single thyroid nodule: Secondary | ICD-10-CM

## 2016-03-08 DIAGNOSIS — R5383 Other fatigue: Secondary | ICD-10-CM

## 2016-03-08 MED ORDER — ESTRADIOL 0.1 MG/GM VA CREA: TOPICAL_CREAM | VAGINAL | Status: DC

## 2016-03-08 NOTE — Patient Instructions (Signed)
Health Maintenance, Female Adopting a healthy lifestyle and getting preventive care can go a long way to promote health and wellness. Talk with your health care provider about what schedule of regular examinations is right for you. This is a good chance for you to check in with your provider about disease prevention and staying healthy. In between checkups, there are plenty of things you can do on your own. Experts have done a lot of research about which lifestyle changes and preventive measures are most likely to keep you healthy. Ask your health care provider for more information. WEIGHT AND DIET  Eat a healthy diet  Be sure to include plenty of vegetables, fruits, low-fat dairy products, and lean protein.  Do not eat a lot of foods high in solid fats, added sugars, or salt.  Get regular exercise. This is one of the most important things you can do for your health.  Most adults should exercise for at least 150 minutes each week. The exercise should increase your heart rate and make you sweat (moderate-intensity exercise).  Most adults should also do strengthening exercises at least twice a week. This is in addition to the moderate-intensity exercise.  Maintain a healthy weight  Body mass index (BMI) is a measurement that can be used to identify possible weight problems. It estimates body fat based on height and weight. Your health care provider can help determine your BMI and help you achieve or maintain a healthy weight.  For females 20 years of age and older:   A BMI below 18.5 is considered underweight.  A BMI of 18.5 to 24.9 is normal.  A BMI of 25 to 29.9 is considered overweight.  A BMI of 30 and above is considered obese.  Watch levels of cholesterol and blood lipids  You should start having your blood tested for lipids and cholesterol at 45 years of age, then have this test every 5 years.  You may need to have your cholesterol levels checked more often if:  Your lipid  or cholesterol levels are high.  You are older than 45 years of age.  You are at high risk for heart disease.  CANCER SCREENING   Lung Cancer  Lung cancer screening is recommended for adults 55-80 years old who are at high risk for lung cancer because of a history of smoking.  A yearly low-dose CT scan of the lungs is recommended for people who:  Currently smoke.  Have quit within the past 15 years.  Have at least a 30-pack-year history of smoking. A pack year is smoking an average of one pack of cigarettes a day for 1 year.  Yearly screening should continue until it has been 15 years since you quit.  Yearly screening should stop if you develop a health problem that would prevent you from having lung cancer treatment.  Breast Cancer  Practice breast self-awareness. This means understanding how your breasts normally appear and feel.  It also means doing regular breast self-exams. Let your health care provider know about any changes, no matter how small.  If you are in your 20s or 30s, you should have a clinical breast exam (CBE) by a health care provider every 1-3 years as part of a regular health exam.  If you are 40 or older, have a CBE every year. Also consider having a breast X-ray (mammogram) every year.  If you have a family history of breast cancer, talk to your health care provider about genetic screening.  If you   are at high risk for breast cancer, talk to your health care provider about having an MRI and a mammogram every year.  Breast cancer gene (BRCA) assessment is recommended for women who have family members with BRCA-related cancers. BRCA-related cancers include:  Breast.  Ovarian.  Tubal.  Peritoneal cancers.  Results of the assessment will determine the need for genetic counseling and BRCA1 and BRCA2 testing. Cervical Cancer Your health care provider may recommend that you be screened regularly for cancer of the pelvic organs (ovaries, uterus, and  vagina). This screening involves a pelvic examination, including checking for microscopic changes to the surface of your cervix (Pap test). You may be encouraged to have this screening done every 3 years, beginning at age 21.  For women ages 30-65, health care providers may recommend pelvic exams and Pap testing every 3 years, or they may recommend the Pap and pelvic exam, combined with testing for human papilloma virus (HPV), every 5 years. Some types of HPV increase your risk of cervical cancer. Testing for HPV may also be done on women of any age with unclear Pap test results.  Other health care providers may not recommend any screening for nonpregnant women who are considered low risk for pelvic cancer and who do not have symptoms. Ask your health care provider if a screening pelvic exam is right for you.  If you have had past treatment for cervical cancer or a condition that could lead to cancer, you need Pap tests and screening for cancer for at least 20 years after your treatment. If Pap tests have been discontinued, your risk factors (such as having a new sexual partner) need to be reassessed to determine if screening should resume. Some women have medical problems that increase the chance of getting cervical cancer. In these cases, your health care provider may recommend more frequent screening and Pap tests. Colorectal Cancer  This type of cancer can be detected and often prevented.  Routine colorectal cancer screening usually begins at 45 years of age and continues through 45 years of age.  Your health care provider may recommend screening at an earlier age if you have risk factors for colon cancer.  Your health care provider may also recommend using home test kits to check for hidden blood in the stool.  A small camera at the end of a tube can be used to examine your colon directly (sigmoidoscopy or colonoscopy). This is done to check for the earliest forms of colorectal  cancer.  Routine screening usually begins at age 50.  Direct examination of the colon should be repeated every 5-10 years through 45 years of age. However, you may need to be screened more often if early forms of precancerous polyps or small growths are found. Skin Cancer  Check your skin from head to toe regularly.  Tell your health care provider about any new moles or changes in moles, especially if there is a change in a mole's shape or color.  Also tell your health care provider if you have a mole that is larger than the size of a pencil eraser.  Always use sunscreen. Apply sunscreen liberally and repeatedly throughout the day.  Protect yourself by wearing long sleeves, pants, a wide-brimmed hat, and sunglasses whenever you are outside. HEART DISEASE, DIABETES, AND HIGH BLOOD PRESSURE   High blood pressure causes heart disease and increases the risk of stroke. High blood pressure is more likely to develop in:  People who have blood pressure in the high end   of the normal range (130-139/85-89 mm Hg).  People who are overweight or obese.  People who are African American.  If you are 38-23 years of age, have your blood pressure checked every 3-5 years. If you are 61 years of age or older, have your blood pressure checked every year. You should have your blood pressure measured twice--once when you are at a hospital or clinic, and once when you are not at a hospital or clinic. Record the average of the two measurements. To check your blood pressure when you are not at a hospital or clinic, you can use:  An automated blood pressure machine at a pharmacy.  A home blood pressure monitor.  If you are between 45 years and 39 years old, ask your health care provider if you should take aspirin to prevent strokes.  Have regular diabetes screenings. This involves taking a blood sample to check your fasting blood sugar level.  If you are at a normal weight and have a low risk for diabetes,  have this test once every three years after 45 years of age.  If you are overweight and have a high risk for diabetes, consider being tested at a younger age or more often. PREVENTING INFECTION  Hepatitis B  If you have a higher risk for hepatitis B, you should be screened for this virus. You are considered at high risk for hepatitis B if:  You were born in a country where hepatitis B is common. Ask your health care provider which countries are considered high risk.  Your parents were born in a high-risk country, and you have not been immunized against hepatitis B (hepatitis B vaccine).  You have HIV or AIDS.  You use needles to inject street drugs.  You live with someone who has hepatitis B.  You have had sex with someone who has hepatitis B.  You get hemodialysis treatment.  You take certain medicines for conditions, including cancer, organ transplantation, and autoimmune conditions. Hepatitis C  Blood testing is recommended for:  Everyone born from [birth year] through 1965.  Anyone with known risk factors for hepatitis C. Sexually transmitted infections (STIs)  You should be screened for sexually transmitted infections (STIs) including gonorrhea and chlamydia if:  You are sexually active and are younger than 45 years of age.  You are older than 45 years of age and your health care provider tells you that you are at risk for this type of infection.  Your sexual activity has changed since you were last screened and you are at an increased risk for chlamydia or gonorrhea. Ask your health care provider if you are at risk.  If you do not have HIV, but are at risk, it may be recommended that you take a prescription medicine daily to prevent HIV infection. This is called pre-exposure prophylaxis (PrEP). You are considered at risk if:  You are sexually active and do not regularly use condoms or know the HIV status of your partner(s).  You take drugs by injection.  You are sexually  active with a partner who has HIV. Talk with your health care provider about whether you are at high risk of being infected with HIV. If you choose to begin PrEP, you should first be tested for HIV. You should then be tested every 3 months for as long as you are taking PrEP.  PREGNANCY   If you are premenopausal and you may become pregnant, ask your health care provider about preconception counseling.  If you may  become pregnant, take 400 to 800 micrograms (mcg) of folic acid every day.  If you want to prevent pregnancy, talk to your health care provider about birth control (contraception). OSTEOPOROSIS AND MENOPAUSE   Osteoporosis is a disease in which the bones lose minerals and strength with aging. This can result in serious bone fractures. Your risk for osteoporosis can be identified using a bone density scan.  If you are 61 years of age or older, or if you are at risk for osteoporosis and fractures, ask your health care provider if you should be screened.  Ask your health care provider whether you should take a calcium or vitamin D supplement to lower your risk for osteoporosis.  Menopause may have certain physical symptoms and risks.  Hormone replacement therapy may reduce some of these symptoms and risks. Talk to your health care provider about whether hormone replacement therapy is right for you.  HOME CARE INSTRUCTIONS   Schedule regular health, dental, and eye exams.  Stay current with your immunizations.   Do not use any tobacco products including cigarettes, chewing tobacco, or electronic cigarettes.  If you are pregnant, do not drink alcohol.  If you are breastfeeding, limit how much and how often you drink alcohol.  Limit alcohol intake to no more than 1 drink per day for nonpregnant women. One drink equals 12 ounces of beer, 5 ounces of wine, or 1 ounces of hard liquor.  Do not use street drugs.  Do not share needles.  Ask your health care provider for help if  you need support or information about quitting drugs.  Tell your health care provider if you often feel depressed.  Tell your health care provider if you have ever been abused or do not feel safe at home.   This information is not intended to replace advice given to you by your health care provider. Make sure you discuss any questions you have with your health care provider.   Document Released: 03/08/2011 Document Revised: 09/13/2014 Document Reviewed: 07/25/2013 Elsevier Interactive Patient Education [employer].

## 2016-03-08 NOTE — Progress Notes (Signed)
Patient ID: Sarah Riley, female   DOB: August 12, 1971, 45 y.o.   MRN: [id number] 45 y.o. [id number] Married [name] here for annual exam.    Having sinus problems. Having tooth pain.   Would like another Mirena IUD. No cycles at all.  Using back up protection as IUD is expiring.   Pain and bleeding with intercourse.  Feels external tearing and pain.  Burning with lubricants.  Tried coconut oil which did not help completely.   PCP:  Oliver BarreJames John, MD  No LMP recorded. Patient is not currently having periods (Reason: IUD).     Period Cycle (Days):  (no cycle with Mirena IUD)     Sexually active: Yes.   female The current method of family planning is IUD--Mirena inserted 03/2011.    Exercising: No.   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 Thelma CompMann;next due 2017 BMD:   n/a  Result  n/a TDaP:  UNSURE Gardasil:   N/A Screening Labs:  Hb today: Declines, Urine today: Unable to void   reports that she has never smoked. She does not have any smokeless tobacco history on file. She reports that she does not drink alcohol or use illicit drugs.  Past Medical History  Diagnosis Date  . ANEMIA-NOS 04/30/2008  . ANEMIA-IRON DEFICIENCY 04/30/2008  . ALLERGIC RHINITIS 10/18/2008  . COLONIC POLYPS, HX OF 04/30/2008    Past Surgical History  Procedure Laterality Date  . Cesarean section  x2  . Intrauterine device insertion  2010    Mirena     Current Outpatient Prescriptions  Medication Sig Dispense Refill  . levonorgestrel (MIRENA) 20 MCG/24HR IUD 1 each by Intrauterine route once.    . Pseudoephedrine-Ibuprofen 30-200 MG TABS Take 1 tablet by mouth as needed.     No current facility-administered medications for this visit.     Family History  Problem Relation Age of Onset  . Hypertension Mother   . Clotting disorder Father     ROS:  Pertinent items are noted in HPI.  Otherwise, a comprehensive ROS was negative.  Exam:   BP 100/62 mmHg  Pulse 80  Resp 18  Ht 5\' 2"  (1.575 m)  Wt 154 lb (69.854 kg)  BMI 28.16 kg/m2    General appearance: alert, cooperative and appears stated age Head: Normocephalic, without obvious abnormality, atraumatic Neck: no adenopathy, supple, symmetrical, trachea midline and thyroid right thyroid nodule 1.5 cm, nontender. Lungs: clear to auscultation bilaterally Breasts: normal appearance, no masses or tenderness, Inspection negative, No nipple retraction or dimpling, No nipple discharge or bleeding, No axillary or supraclavicular adenopathy Heart: regular rate and rhythm Abdomen:  soft, non-tender; no masses, no organomegaly Extremities: extremities normal, atraumatic, no cyanosis or edema Skin: Skin color, texture, turgor normal. No rashes or lesions Lymph nodes: Cervical, supraclavicular, and axillary nodes normal. No abnormal inguinal nodes palpated Neurologic: Grossly normal  Pelvic: External genitalia:  no lesions              Urethra:  normal appearing urethra with no masses, tenderness or lesions              Bartholins and Skenes: normal                 Vagina: normal appearing vagina with normal color and discharge,  no lesions              Cervix: no lesions and IUD strigs not seen.              Pap taken: Yes.   Bimanual Exam:  Uterus:  normal size, contour, position, consistency, mobility, non-tender              Adnexa: normal adnexa and no mass, fullness, tenderness              Rectal exam: Yes.  .  Confirms.              Anus:  normal sphincter tone, no lesions  Chaperone was present for exam.  Assessment:   Well woman visit with normal exam. Hx LGSIL.  Prior lapse in GYN care. Insertional dyspareunia and bleeding.  Right thyroid nodule. Expired IUD.   Strings not visible. Sinusitis/tooth pain.   Plan: Yearly mammogram recommended after age 45.  Recommended self breast exam.  Pap and HR HPV as above. Discussed Calcium, Vitamin D, regular exercise program including cardiovascular and weight bearing exercise. Labs performed.  Yes.  .   See orders.  Routine labs ordered.  These will be done when the patient returns for her IUD exchange. Prescription medication(s) given.  Estrace vaginal cream.  Instructed in used.  Discussed risk of DVT, PE, MI, stroke and breast CA. Use condoms.  Plan for IUD removal and reinsertion of Mirena IUD with ultrasound guidance. Thyroid ultrasound ordered.  TDap at IUD exchange visit.  She will follow up with her dentist. Follow up annually and prn.       After visit summary provided.

## 2016-03-08 NOTE — Telephone Encounter (Signed)
Called patient. She is scheduled for Thyroid ultrasound on 03/12/16 at [facility] at [address], [place] [id number] at 1540. This will work for her. She will call back with any concerns prior to appointment.

## 2016-03-08 NOTE — Telephone Encounter (Signed)
Thank you for scheduling the thyroid ultrasound appointment.  Please keep her in imaging hold.

## 2016-03-11 ENCOUNTER — Telehealth: Payer: Self-pay | Admitting: Obstetrics and Gynecology

## 2016-03-11 LAB — IPS PAP TEST WITH HPV

## 2016-03-12 ENCOUNTER — Other Ambulatory Visit: Payer: BLUE CROSS/BLUE SHIELD

## 2016-03-12 NOTE — Telephone Encounter (Signed)
Spoke with pt regarding benefit for ultrasound guided IUD removal and reinsertion. Patient understood and agreeable. Patient ready to schedule. Patient scheduled 03/25/16 with Dr Edward JollySilva. Pt aware of arrival date and time. Pt aware of 72 hours cancellation policy.  Patient asked about appointment information for a head/neck ultrasound. I advised patient, per phone documentation on 03/08/16, the appointment is today at 4:00 [facility].  Patient states she was not aware of this appointment date and states she will need to reschedule.  I provided to patient the phone number to [facility] [phone] to call and reschedule. Patient is agreeable to this information.  Routing to Triage

## 2016-03-12 NOTE — Telephone Encounter (Signed)
Noted message from WardnerSuzy.  Patient was made aware of appointment via phone and agreed.  Continued hold for results.  Will route to Dr. Edward JollySilva.

## 2016-03-15 ENCOUNTER — Ambulatory Visit
Admission: RE | Admit: 2016-03-15 | Discharge: 2016-03-15 | Disposition: A | Discharge status: Home / Self Care | Payer: BLUE CROSS/BLUE SHIELD | Source: Ambulatory Visit | Attending: Obstetrics and Gynecology | Admitting: Obstetrics and Gynecology

## 2016-03-15 DIAGNOSIS — E041 Nontoxic single thyroid nodule: Secondary | ICD-10-CM

## 2016-03-16 ENCOUNTER — Ambulatory Visit: Payer: BLUE CROSS/BLUE SHIELD | Admitting: Internal Medicine

## 2016-03-16 ENCOUNTER — Telehealth: Payer: Self-pay | Admitting: Emergency Medicine

## 2016-03-16 DIAGNOSIS — IMO0002 Reserved for concepts with insufficient information to code with codable children: Secondary | ICD-10-CM

## 2016-03-16 NOTE — Telephone Encounter (Signed)
Message left to return call to Vincentracy at [phone].   Patient is scheduled for Mirena removal and reinsertion on 03/25/16 as IUD strings not visible on exam.  No cycles with IUD.

## 2016-03-16 NOTE — Telephone Encounter (Signed)
-----   Message from Patton SallesBrook E Amundson C Silva, MD sent at 03/13/2016  9:02 PM EDT ----- Please inform patient of pap showing LGSIL.  HR HPV is negative. Needs colpo with me.   Cc- Claudette LawsAmanda Dixon

## 2016-03-17 NOTE — Telephone Encounter (Signed)
Dr. Edward JollySilva,  Patient is scheduled for Mirena IUD replacement with ultrasound on 03/25/16. Okay to schedule colposcopy after IUD placement?

## 2016-03-18 NOTE — Telephone Encounter (Signed)
Thyroid U/S normal. Pap showing LGSIL.  Need colposcopy prior to IUD exchange with ultrasound guidance.

## 2016-03-18 NOTE — Telephone Encounter (Signed)
Call again to patient. Message left to return call to [place] at [phone].    She will need colposcopy prior to mirena IUD exchange per Dr. Edward JollySilva.  Needs pap smear results and ultrasound of thyroid results and reschedule Mirena IUD exchange with ultrasound guidance.

## 2016-03-18 NOTE — Telephone Encounter (Signed)
-----   Message from Patton SallesBrook E Amundson C Silva, MD sent at 03/16/2016  7:23 PM EDT ----- Please inform patient of her thyroid ultrasound showing benign cysts of both sides of the thyroid.  The right side of the thyroid measures slightly larger than the left due to these cysts.  No follow up is needed.  All of this looks benign.   We will do blood work to test her thyroid when she returns for her IUD exchange using ultrasound guidance.   She may be removed from imaging hold.  Cc- Claudette LawsAmanda Dixon.

## 2016-03-18 NOTE — Telephone Encounter (Signed)
Patient returned call.  She is advised of normal ultrasound and plans from Dr. Edward JollySilva regarding lab testing of thyroid at next visit.  Advised patient of colposcopy results. She is agreeable to scheduling. Cancelled Ultrasound/IUD exchange for 03/25/16 and scheduled colposcopy for 1500 the same day.  She would like to wait until she has her next appointment for procedure with Dr. Edward JollySilva to plan for ultrasound and IUD exchange.  Pre-procedure instructions given. Motrin 800 mg PO one hour before appointment with food and water. Patient verbalized understanding and will call back with any concerns prior to appointment for colposcopy.  Will close encounter. cc Harland DingwallSuzy Dixon with ultrasound appointment update.

## 2016-03-22 ENCOUNTER — Telehealth: Payer: Self-pay | Admitting: Obstetrics and Gynecology

## 2016-03-22 NOTE — Telephone Encounter (Signed)
Call to patient to review benefits for procedure scheduled 03/25/16. Left voicemail for patient to return call and ask for JeffersonBecky or Suzy. Benefits in guarantor notes.

## 2016-03-25 ENCOUNTER — Ambulatory Visit: Payer: BLUE CROSS/BLUE SHIELD | Admitting: Obstetrics and Gynecology

## 2016-03-25 ENCOUNTER — Telehealth: Payer: Self-pay | Admitting: Obstetrics and Gynecology

## 2016-03-25 ENCOUNTER — Other Ambulatory Visit: Payer: BLUE CROSS/BLUE SHIELD | Admitting: Obstetrics and Gynecology

## 2016-03-25 ENCOUNTER — Other Ambulatory Visit: Payer: BLUE CROSS/BLUE SHIELD

## 2016-03-25 ENCOUNTER — Encounter: Payer: Self-pay | Admitting: Obstetrics and Gynecology

## 2016-03-25 DIAGNOSIS — R87612 Low grade squamous intraepithelial lesion on cytologic smear of cervix (LGSIL): Secondary | ICD-10-CM

## 2016-03-25 NOTE — Telephone Encounter (Signed)
Patient did not keep her appointment for today for a colposcopy. I called her and left a message to call back and reschedule.

## 2016-03-25 NOTE — Telephone Encounter (Signed)
Patient returning call to reschedule colposcopy procedure.

## 2016-03-25 NOTE — Telephone Encounter (Signed)
Please reschedule colposcopy appointment.   Cc- Sarah Riley

## 2016-03-25 NOTE — Telephone Encounter (Signed)
Routing to triage for rescheduling.

## 2016-03-26 NOTE — Telephone Encounter (Signed)
Patient calling to schedule colposcopy.  States OK to schedule first available and she will make it work.  Routing to triage to schedule.

## 2016-03-26 NOTE — Telephone Encounter (Signed)
Return call to patient. Offers apologies that she wrote down wrong time for arrival for procedure.  Colposcopy scheduled for 03/29/16 at 1500 arrive at 1400.  Pre-procedure instructions given. Motrin 800 mg PO one hour before appointment with food and water.  Patient agreeable to appointment and is scheduled,.   Routing to provider for final review. Patient agreeable to disposition. Will close encounter.

## 2016-03-29 ENCOUNTER — Encounter: Payer: Self-pay | Admitting: Obstetrics and Gynecology

## 2016-03-29 ENCOUNTER — Ambulatory Visit: Payer: BLUE CROSS/BLUE SHIELD | Admitting: Obstetrics and Gynecology

## 2016-03-29 ENCOUNTER — Telehealth: Payer: Self-pay | Admitting: Obstetrics and Gynecology

## 2016-03-29 NOTE — Progress Notes (Deleted)
Subjective:     Patient ID: Sarah Riley, female   DOB: 06-19-1971, 45 y.o.   MRN: [id number]  HPI  Patient here today for colposcopy; pap 03-08-16 LGSIL:Neg HR HPV. Hx LGSIL 2012 with colposcopy but no treatment to cervix.  08-09-14 pap ASCUS:Neg HR HPV.    Review of Systems  LMP: Contraception: Mirena IUD inserted 03/2011--expired--??using condoms     Objective:   Physical Exam     Assessment:     ***    Plan:     ***

## 2016-03-29 NOTE — Telephone Encounter (Signed)
Patient did not keep her appointment for a colposcopy today. I called her and left a message on her mobile number to call back to reschedule.

## 2016-03-30 NOTE — Telephone Encounter (Signed)
03/30/16 [facility]: This patient will need to be discharged from the practice if she has any future failed appointments with our office per Dr. Edward Jolly.

## 2016-04-02 NOTE — Telephone Encounter (Signed)
Call to patient to reschedule colpo. Voice mail box is full, unable to leave message.

## 2016-04-08 NOTE — Telephone Encounter (Signed)
Call to patient. Advised calling to reschedule colpo appointment that she has previously missed twice. Discussed importance of this appointment as well as office policy for missed appointments/procedures. Advised that 3rd missed appointment will result in discharge from practice.  Patient states she does not want this and desires to reschedule. Advised we dont want this either so want to schedule at a time she can be sure she can keep appointment. Patietn has IUD in place that  Was due for replacement in July. She does not cycle with IUD. Colpo scheduled for 04-16-16 at 1000. Instructed to take Motrin 800 mg one hour prior with food.

## 2016-04-15 ENCOUNTER — Telehealth: Payer: Self-pay | Admitting: Obstetrics and Gynecology

## 2016-04-15 NOTE — Telephone Encounter (Signed)
[facility] and is now scheduled for colpo tomorrow 04-16-16.  Routing to provider for final review. Patient agreeable to disposition. Will close encounter.

## 2016-04-15 NOTE — Telephone Encounter (Signed)
Patient returned call. Advised that although precious colposcopy may have been negative, that does not been that there has not been a change. The Pap smear is a screening for cervical cancer and without colposcopy, we cannot tell the extent of the abnormality on her cervix. Not recommended to omit colpo. Patient will consider and call back.

## 2016-04-15 NOTE — Telephone Encounter (Signed)
Spoke with patient regarding benefits for recommended procedure. Patient states she "has had test before and it always comes back negative". Patient wants to know "based on past results, can she opt of this procedure and just have mirena removed and replaced?"  I advised I will forward her concerns to our nurse supervisor for review.  Routing to [employer]akley

## 2016-04-15 NOTE — Telephone Encounter (Signed)
Call to patient, left message to call back. 

## 2016-04-16 ENCOUNTER — Encounter: Payer: Self-pay | Admitting: Obstetrics and Gynecology

## 2016-04-16 ENCOUNTER — Ambulatory Visit (INDEPENDENT_AMBULATORY_CARE_PROVIDER_SITE_OTHER): Payer: BLUE CROSS/BLUE SHIELD | Admitting: Obstetrics and Gynecology

## 2016-04-16 VITALS — BP 100/60 | HR 76 | Resp 16 | Ht 62.0 in | Wt 158.0 lb

## 2016-04-16 DIAGNOSIS — R87612 Low grade squamous intraepithelial lesion on cytologic smear of cervix (LGSIL): Secondary | ICD-10-CM

## 2016-04-16 DIAGNOSIS — Z30432 Encounter for removal of intrauterine contraceptive device: Secondary | ICD-10-CM

## 2016-04-16 NOTE — Patient Instructions (Signed)

## 2016-04-16 NOTE — Progress Notes (Signed)
Subjective:     Patient ID: Sarah Riley, female   DOB: 1971-03-14, 45 y.o.   MRN: [id number]   HPI  Pap 03/08/16 showing LGSIL and negative HR HPV. Pap 08/09/14 ASCUS, negative HR HPV.  Colpo 2012 - LGSIL.  No tx.   IUD expired.  Negative UPT today.   Review of Systems     Objective:   Physical Exam  Genitourinary:      Colposcopy. Consent for procedure.  Speculum placed.  3% acetic acid used.  Colposcopy unsatisfactory. Endocervical speculum used.  ECC obtained.  I think I saw the tip of her IUD strings.     Assessment:     LGSIL pap.  Unsatisfactory colposcopy.  Expired IUD.    Plan:     Follow up ECC.  Post colpo instructions given.  Plan for IUD removal and LEEP on the same day.  Procedures discussed. Increased risk of PTL and delivery discussed for future pregnancy if desired.   After visit summary to patient.

## 2016-04-20 LAB — IPS OTHER TISSUE BIOPSY

## 2016-04-29 ENCOUNTER — Telehealth: Payer: Self-pay | Admitting: *Deleted

## 2016-04-29 NOTE — Telephone Encounter (Signed)
-----   Message from Patton SallesBrook E Amundson C Silva, MD sent at 04/21/2016  5:16 PM EDT ----- Please inform patient of her colpo ECC result showing dysplasia of the cervix.  I do recommend proceeding forward with removal of the IUD and a LEEP procedure on the same day.  These procedures should be in the work queue.  Cc- Claudette LawsAmanda Dixon

## 2016-04-29 NOTE — Telephone Encounter (Signed)
Call to patient. Left message to call back on cell number. Unable to leave message on home number (voice mail asks for "remote access code").Patietn may speak to Sarah Riley or Sarah RoundsSally.

## 2016-04-30 ENCOUNTER — Telehealth: Payer: Self-pay | Admitting: Obstetrics and Gynecology

## 2016-04-30 NOTE — Telephone Encounter (Signed)
Call to patient. Advised of colpo results as advised by Dr Edward JollySilva. Patietn agreeable to proceed with LEEP. Still not having cycles with IUD in place and is using backup method of contraception. LEEP with IUD removal scheduled for 05-17-16.  Routing to provider for final review. Patient agreeable to disposition. Will close encounter.

## 2016-04-30 NOTE — Telephone Encounter (Signed)
Called patient to review benefits for procedure.Unable to leave voicemail to call back and review. Patients home number rings continuously with no answer machine and her mobile number's voicemail box is full and unable to accept messages. Patient is scheduled 05/17/16 for her LEEP.

## 2016-05-03 NOTE — Telephone Encounter (Signed)
Spoke with patient. Notified of benefits. Understood. Understood and agreeable to appointment date/time and 72 hour cancellation policy. Ok to close

## 2016-05-17 ENCOUNTER — Encounter: Payer: Self-pay | Admitting: Obstetrics and Gynecology

## 2016-05-17 ENCOUNTER — Encounter: Payer: BLUE CROSS/BLUE SHIELD | Admitting: Obstetrics and Gynecology

## 2016-05-17 DIAGNOSIS — R87612 Low grade squamous intraepithelial lesion on cytologic smear of cervix (LGSIL): Secondary | ICD-10-CM

## 2016-05-17 DIAGNOSIS — Z3009 Encounter for other general counseling and advice on contraception: Secondary | ICD-10-CM

## 2016-05-17 LAB — POCT URINE PREGNANCY: Preg Test, Ur: NEGATIVE

## 2016-05-17 NOTE — Progress Notes (Signed)
Subjective:     Patient ID: Sarah Riley, female   DOB: 01-Jun-1971, 45 y.o.   MRN: [id number]  HPI  Patient here today for LEEP procedue. Pap history as follows: Colposcopy 04-16-16 ECC - dysplasia. Unsatisfactory colposcopy. Pap 03/08/16 showing LGSIL and negative HR HPV. Pap 08/09/14 ASCUS, negative HR HPV.  Colpo 2012 - LGSIL.  No tx.   Review of Systems  LMP: Mirena IUD--no cycle even though expired Contraception:Condoms--Expired Mirena IUD(03/2011) WUJ:WJXBJYNWPT:Negative     Objective:   Physical Exam     Assessment:      No LEEP or IUD removed today.     Plan:      patient left without being seen today by provider.  Went to pick up child.

## 2016-05-26 ENCOUNTER — Ambulatory Visit: Payer: BLUE CROSS/BLUE SHIELD | Admitting: Obstetrics and Gynecology

## 2016-05-26 ENCOUNTER — Telehealth: Payer: Self-pay | Admitting: Obstetrics and Gynecology

## 2016-05-26 NOTE — Telephone Encounter (Signed)
Spoke with patient. Patient called to reschedule LEEP and IUD removal. Patient scheduled for 06/09/16 at 3:00pm with Dr. Edward JollySilva. Patient declines earlier appointments offered due to her schedule. Patient agreeable to date and time.   Routing to provider for final review. Patient is agreeable to disposition. Will close encounter.

## 2016-05-26 NOTE — Telephone Encounter (Signed)
Patient had the wrong appointment time for today. She thought her appointment was at 3pm but it was this morning at 10am. She would like to reschedule the LEEP AND IUD removal.

## 2016-05-28 ENCOUNTER — Ambulatory Visit: Payer: BLUE CROSS/BLUE SHIELD | Admitting: Obstetrics and Gynecology

## 2016-05-28 ENCOUNTER — Other Ambulatory Visit: Payer: Self-pay

## 2016-05-28 DIAGNOSIS — N879 Dysplasia of cervix uteri, unspecified: Secondary | ICD-10-CM

## 2016-05-28 DIAGNOSIS — Z30432 Encounter for removal of intrauterine contraceptive device: Secondary | ICD-10-CM

## 2016-06-09 ENCOUNTER — Ambulatory Visit (INDEPENDENT_AMBULATORY_CARE_PROVIDER_SITE_OTHER): Payer: BLUE CROSS/BLUE SHIELD | Admitting: Obstetrics and Gynecology

## 2016-06-09 ENCOUNTER — Encounter: Payer: Self-pay | Admitting: Obstetrics and Gynecology

## 2016-06-09 VITALS — BP 104/60 | HR 76 | Ht 62.0 in | Wt 160.2 lb

## 2016-06-09 DIAGNOSIS — N870 Mild cervical dysplasia: Secondary | ICD-10-CM | POA: Diagnosis not present

## 2016-06-09 DIAGNOSIS — Z30432 Encounter for removal of intrauterine contraceptive device: Secondary | ICD-10-CM

## 2016-06-09 DIAGNOSIS — N879 Dysplasia of cervix uteri, unspecified: Secondary | ICD-10-CM

## 2016-06-09 DIAGNOSIS — Z3009 Encounter for other general counseling and advice on contraception: Secondary | ICD-10-CM

## 2016-06-09 NOTE — Patient Instructions (Signed)
Loop Electrosurgical Excision Procedure, Care After Refer to this sheet in the next few weeks. These instructions provide you with information on caring for yourself after your procedure. Your caregiver may also give you more specific instructions. Your treatment has been planned according to current medical practices, but problems sometimes occur. Call your caregiver if you have any problems or questions after your procedure. HOME CARE INSTRUCTIONS   Do not use tampons, douche, or have sexual intercourse for 2 weeks or as directed by your caregiver.  Begin normal activities if you have no or minimal cramping or bleeding, unless directed otherwise by your caregiver.  Take your temperature if you feel sick. Write down your temperature on paper, and tell your caregiver if you have a fever.  Take all medicines as directed by your caregiver.  Keep all your follow-up appointments and Pap tests as directed by your caregiver. SEEK IMMEDIATE MEDICAL CARE IF:   You have bleeding that is heavier or longer than a normal menstrual cycle.  You have bleeding that is bright red.  You have blood clots.  You have a fever.  You have increasing cramps or pain not relieved by medicine.  You develop abdominal pain that does not seem to be related to the same area of earlier cramping and pain.  You are lightheaded, unusually weak, or faint.  You develop painful or bloody urination.  You develop a bad smelling vaginal discharge. MAKE SURE YOU:  Understand these instructions.  Will watch your condition.  Will get help right away if you are not doing well or get worse.   This information is not intended to replace advice given to you by your health care provider. Make sure you discuss any questions you have with your health care provider.   Document Released: 05/06/2011 Document Revised: 09/13/2014 Document Reviewed: 05/06/2011 Elsevier Interactive Patient Education 2016 Elsevier Inc.  

## 2016-06-09 NOTE — Progress Notes (Signed)
Subjective:     Patient ID: Sarah Riley, female   DOB: [birth year], 45 y.o.   MRN: [id number]  HPI  Patient here today for LEEP procedure. Pap 03/08/16 showing LGSIL and negative HR HPV. Colposcopy 04-16-16 unsatisfactory.  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 Pap 08/09/14 ASCUS, negative HR HPV.  Colpo 2012 - LGSIL.  No tx.   Review of Systems  LMP: Mirena IUD--inserted 03/2011--no cycles even though IUD has expired Contraception: Abstinence  WUJ:WJXBJYNWPT:Negative     Objective:   Physical Exam  Genitourinary:      Procedures - IUD removal and LEEP.  Consent for procedures.  LEEP written consent.  IUD removal verbal consent.  Insulated speculum placed in vagina.  Grounding pad placed on left thigh. No IUD strings noted.  Hibiclens prep to cervix.  Lugol's staining of cervix.  Good uptake of iodine with no areas of decreased uptake.  Local 1% lidocaine with epi 1:100,000, 15 cc.  Lot #[id number], exp 01/2018. Os finder used and then cervical dilators.  IUD strings identified with a combination of dressing forceps and IUD string locator.  IUD removed intact and discarded.   Cervix resterilized with Hibiclens.  LEEP performed with green loop in one pass with cautery setting of 50 watts.  Tissue removed and later placed on cork with yellow pin at 12:00. Endocervical pass performed with yellow loop in one pass with cautery setting of 50 watts. Tissue removed and later placed on cork with blue pin at 12:00.  Cautery ball used to create hemostasis - setting 50 watts coagulation.  Monsel's placed.  Good hemostasis.   No complications.  Minimal EBL.      Assessment:     Expired Mirena IUD - removed.  LGSIL and unsatisfactory colpo. LEEP conization performed.     Plan:     Follow up LEEP pathology specimens. Instructions and precautions given.  Return for 4 week recheck.  Will plan for New Mirena IUD placement with next cycle after heals from LEEP.  I did discuss other  options for contraception, and patient really favors the Mirena.   After visit summary to patient.

## 2016-06-11 LAB — IPS OTHER TISSUE BIOPSY

## 2016-06-14 ENCOUNTER — Telehealth: Payer: Self-pay

## 2016-06-14 NOTE — Telephone Encounter (Signed)
Spoke with patient. Advised of message as seen below from Dr.Silva. Patient is agreeable and verbalizes understanding. 08 recall placed. Patient will call with the first day of her next menses to schedule IUD insertion.  Routing to provider for final review. Patient agreeable to disposition. Will close encounter.

## 2016-06-14 NOTE — Telephone Encounter (Signed)
-----   Message from Patton SallesBrook E Amundson C Silva, MD sent at 06/13/2016  6:13 PM EDT ----- Please report LEEP specimen showing LGSIL.  Margins were negative.  Please enter recall - 08. Cotesting will be due in one year.   She will be returning for her Mirena IUD after she heals from the LEEP and is then on her menses.

## 2016-06-14 NOTE — Telephone Encounter (Signed)
Left message to call Kaitlyn at [phone]. 

## 2016-06-22 ENCOUNTER — Ambulatory Visit (INDEPENDENT_AMBULATORY_CARE_PROVIDER_SITE_OTHER): Payer: BLUE CROSS/BLUE SHIELD | Admitting: Certified Nurse Midwife

## 2016-06-22 ENCOUNTER — Telehealth: Payer: Self-pay | Admitting: Obstetrics and Gynecology

## 2016-06-22 ENCOUNTER — Encounter: Payer: Self-pay | Admitting: Certified Nurse Midwife

## 2016-06-22 VITALS — BP 98/62 | HR 76 | Temp 97.6°F | Resp 16 | Ht 62.0 in | Wt 159.0 lb

## 2016-06-22 DIAGNOSIS — R319 Hematuria, unspecified: Secondary | ICD-10-CM | POA: Diagnosis not present

## 2016-06-22 DIAGNOSIS — N390 Urinary tract infection, site not specified: Secondary | ICD-10-CM

## 2016-06-22 LAB — POCT URINALYSIS DIPSTICK
Bilirubin, UA: NEGATIVE
Glucose, UA: NEGATIVE
NITRITE UA: NEGATIVE
PH UA: 5
Protein, UA: NEGATIVE
UROBILINOGEN UA: NEGATIVE

## 2016-06-22 MED ORDER — NITROFURANTOIN MONOHYD MACRO 100 MG PO CAPS: 100.0000 mg | ORAL_CAPSULE | Freq: Two times a day (BID) | ORAL | 0 refills | Status: DC

## 2016-06-22 NOTE — Progress Notes (Signed)
45 y.o. Married [name] [phone] here with complaint of UTI, with onset 2 days ago Patient complaining of some urinary frequency/urgency and foul smelling urine only Denies pain with urination. Patient denies fever, chills, nausea or back pain.Denies any vaginal symptoms except for dark brown discharge from LEEP and IUD removal on 06/09/16. Planning new IUD insertion in about one month, so not sexually active now  . Patient has not been consuming adequate fluids. Has only had one glass of fluid today and has eaten very little. Has been very busy at work. History of UTI's in past. Last one 09/10/15 with positive culture for E.Coli. Patient declines any type of pelvic exam due to the discomfort from procedure.  No other health issues.    O: Healthy female WDWN Affect: Normal, orientation x 3 Skin : warm and dry CVAT: negative bilateral Abdomen: postive for suprapubic tenderness  Pelvic exam:deferred A: UTI Normal pelvic exam Poct urine-wbc 1+, rbc 1+, ketones tr P: Reviewed findings of UTI and need for treatment. IO:NGEXBMWURx:Macrobid see order with instructions XLK:GMWNULab:Urine micro, culture Reviewed warning signs and symptoms of UTI and need to advise if occurring. Encouraged to limit soda, tea, and coffee and be sure to increase water intake. Discussed importance for eating small frequent meals and fluids to help with normal healing process. Suggestions made of preparing food the night before and mixing small amount of juice in with water to increase water intake. Patient plans to try. Will advise if symptoms do resolve.   RV prn

## 2016-06-22 NOTE — Patient Instructions (Signed)

## 2016-06-22 NOTE — Telephone Encounter (Signed)
Spoke with patient. Patient states she feels like she is at the start of a UTI. Patient states she has had UTIs in the past. Patient had LEEP on 06/09/16 with Dr. Edward JollySilva. Patient reports strong odor and dark urine that started "a couple of days ago". Denies burning with urination, states feels "warmer". Reports urinary frequency. Patient states vaginal discharge is still "coffee ground" due to LEEP. Denies fever or abdominal pain. Recommended patient come in for OV for further evaluation. Advised Dr. Edward JollySilva is out of the office today, could schedule with a covering provider. Patient states she has seen Leota Sauerseborah Leonard, CNM in the past. Patient scheduled with Leota Sauerseborah Leonard, CNM 06/22/16 at 2:00pm. Patient verbalizes understanding and is agreeable to date and time.    Cc: Dr. Edward JollySilva

## 2016-06-22 NOTE — Progress Notes (Signed)
Encounter reviewed [name] Jertson, MD   

## 2016-06-22 NOTE — Telephone Encounter (Signed)
Patient had a LEEP done last week and now thinks she may have a UTI. She would like to speak with the nurse before scheduling an appointment.

## 2016-06-23 LAB — URINALYSIS, MICROSCOPIC ONLY
CASTS: NONE SEEN [LPF]
Crystals: NONE SEEN [HPF]
Yeast: NONE SEEN [HPF]

## 2016-06-24 LAB — URINE CULTURE: Colony Count: >=100000

## 2016-07-07 ENCOUNTER — Encounter: Payer: Self-pay | Admitting: Obstetrics and Gynecology

## 2016-07-07 ENCOUNTER — Ambulatory Visit (INDEPENDENT_AMBULATORY_CARE_PROVIDER_SITE_OTHER): Payer: BLUE CROSS/BLUE SHIELD | Admitting: Obstetrics and Gynecology

## 2016-07-07 VITALS — BP 100/64 | HR 84 | Ht 62.0 in | Wt 157.2 lb

## 2016-07-07 DIAGNOSIS — R87612 Low grade squamous intraepithelial lesion on cytologic smear of cervix (LGSIL): Secondary | ICD-10-CM | POA: Diagnosis not present

## 2016-07-07 DIAGNOSIS — Z3009 Encounter for other general counseling and advice on contraception: Secondary | ICD-10-CM | POA: Diagnosis not present

## 2016-07-07 NOTE — Progress Notes (Signed)
GYNECOLOGY  VISIT   HPI: 45 y.o.   Married  [place]   [phone] with No LMP recorded. Patient is not currently having periods (Reason: Other).   here for follow up from LEEP procedure and IUD removal done on 06/09/16. Final pathology showed LGSIL and negative margins.   Interested in a new Mirena IUD.   Just treated for an E Coli UTi with Macrobid.  Still finishing abx.  GYNECOLOGIC HISTORY: No LMP recorded. Patient is not currently having periods (Reason: Other). Contraception:  Abstinence Menopausal hormone therapy:  n/a Last mammogram:  09-03-15 Density C/asymmetry of Lt.Br.--10-29-14 Lt.Diag./Neg/superimposed normal fibroglandular tissue/biRads1:[facility] Last pap smear:  03-08-16 LSIL:Neg HR HPV        OB History    Gravida Para Term Preterm AB Living   2 2 2     2    SAB TAB Ectopic Multiple Live Births                     Patient Active Problem List   Diagnosis Date Noted  . SI (sacroiliac) joint dysfunction 11/27/2013  . Nonallopathic lesion of sacral region 11/27/2013  . Cluster headache 02/10/2012  . Preventative health care 08/09/2011  . ALLERGIC RHINITIS 10/18/2008  . FATIGUE 10/18/2008  . ANEMIA-IRON DEFICIENCY 04/30/2008  . COLONIC POLYPS, HX OF 04/30/2008    Past Medical History:  Diagnosis Date  . ALLERGIC RHINITIS 10/18/2008  . ANEMIA-IRON DEFICIENCY 04/30/2008  . ANEMIA-NOS 04/30/2008  . COLONIC POLYPS, HX OF 04/30/2008    Past Surgical History:  Procedure Laterality Date  . CESAREAN SECTION  x2  . INTRAUTERINE DEVICE INSERTION  2010   Mirena & removed during leep procedure    Current Outpatient Prescriptions  Medication Sig Dispense Refill  . nitrofurantoin, macrocrystal-monohydrate, (MACROBID) 100 MG capsule Take 1 capsule (100 mg total) by mouth 2 (two) times daily. 14 capsule 0   No current facility-administered medications for this visit.      ALLERGIES: Oxycodone  Family History  Problem Relation Age of Onset  .  Hypertension Mother   . Clotting disorder Father     Social History   Social History  . Marital status: Married    Spouse name: N/A  . Number of children: N/A  . Years of education: N/A   Occupational History  . Not on file.   Social History Main Topics  . Smoking status: Never Smoker  . Smokeless tobacco: Never Used  . Alcohol use No  . Drug use: No  . Sexual activity: Not Currently    Partners: Male    Birth control/ protection: Abstinence   Other Topics Concern  . Not on file   Social History Narrative  . No narrative on file    ROS:  Pertinent items are noted in HPI.  PHYSICAL EXAMINATION:    BP 100/64 (BP Location: Right Arm, Patient Position: Sitting, Cuff Size: Normal)   Pulse 84   Ht 5\' 2"  (1.575 m)   Wt 157 lb 3.2 oz (71.3 kg)   BMI 28.75 kg/m     General appearance: alert, cooperative and appears stated age  Pelvic: External genitalia:  no lesions              Urethra:  normal appearing urethra with no masses, tenderness or lesions              Bartholins and Skenes: normal  Vagina: normal appearing vagina with normal color and discharge, no lesions              Cervix: no lesions                Bimanual Exam:  Uterus:  normal size, contour, position, consistency, mobility, non-tender              Adnexa: no mass, fullness, tenderness            Chaperone was present for exam.  ASSESSMENT  Status post LEEP for LGSIL.  Desire for Mirena IUD.  Current tx for E Coli UTI.  PLAN  Discussed LGSIL.  Pap and HR HPV testing in one year.  Already in recall - 08.  Finish abx.  Call with menses and will placed next Mirena IUD.  I discussed the possibility of needing to do cervical dilation for the IUD due to scarring from LEEP.  We also discussed the possible need for removal if patient needed future tx of dysplasia.  She accepts this and really would like this form of contraception.    An After Visit Summary was printed and given to  the patient.  __15____ minutes face to face time of which over 50% was spent in counseling.

## 2016-07-26 ENCOUNTER — Telehealth: Payer: Self-pay | Admitting: Obstetrics and Gynecology

## 2016-07-26 DIAGNOSIS — Z30430 Encounter for insertion of intrauterine contraceptive device: Secondary | ICD-10-CM

## 2016-07-26 NOTE — Telephone Encounter (Signed)
Patient called and states she started her period yesterday and needs to schedule her IUD insertion.

## 2016-07-26 NOTE — Telephone Encounter (Signed)
Spoke with patient. Patient started her menses yesterday 07/25/2016 and would like to schedule IUD insertion. Appointment scheduled for 07/28/2016 at 10 am with Dr.Miller as Dr.Silva is out of the office this week. Patient is agreeable. Pre procedure instructions given.  Motrin instructions given. Motrin=Advil=Ibuprofen, 800 mg one hour before appointment. Eat a meal and hydrate well before appointment. Order placed for precert.  Routing to covering provider for final review. Patient agreeable to disposition. Will close encounter.

## 2016-07-28 ENCOUNTER — Ambulatory Visit (INDEPENDENT_AMBULATORY_CARE_PROVIDER_SITE_OTHER): Payer: BLUE CROSS/BLUE SHIELD | Admitting: Obstetrics & Gynecology

## 2016-07-28 ENCOUNTER — Encounter: Payer: Self-pay | Admitting: Obstetrics & Gynecology

## 2016-07-28 ENCOUNTER — Ambulatory Visit: Payer: BLUE CROSS/BLUE SHIELD | Admitting: Obstetrics & Gynecology

## 2016-07-28 VITALS — BP 112/70 | HR 84 | Resp 16 | Ht 62.0 in | Wt 161.0 lb

## 2016-07-28 DIAGNOSIS — Z30430 Encounter for insertion of intrauterine contraceptive device: Secondary | ICD-10-CM

## 2016-07-28 DIAGNOSIS — Z30433 Encounter for removal and reinsertion of intrauterine contraceptive device: Secondary | ICD-10-CM

## 2016-07-28 DIAGNOSIS — Z309 Encounter for contraceptive management, unspecified: Secondary | ICD-10-CM

## 2016-07-28 NOTE — Progress Notes (Addendum)
45 y.o. 22P2002 Married [name] presents for insertion of new Mirena IUD.  Has minimal bleeding with IUD.  IUD was overdue for removal so Dr. Edward JollySilva removed when performed LEEP 06/09/16.  Pt has waited for cycle to begin for placement.  LMP:  07/25/16.  This will be third Mirena for pt.  She has been very pleased with this method of contraception and bleeding control.  Has no questions.    Pt does not want to discuss other options as she has been so pleased with this one.  Pt has also been counseled about risks and benefits as well as complications.  Consent is obtained today.  All questions answered prior to start of procedure.    Current contraception: none  LMP:  Patient's last menstrual period was 07/25/2016.  Patient Active Problem List   Diagnosis Date Noted  . SI (sacroiliac) joint dysfunction 11/27/2013  . Nonallopathic lesion of sacral region 11/27/2013  . Cluster headache 02/10/2012  . Preventative health care 08/09/2011  . ALLERGIC RHINITIS 10/18/2008  . FATIGUE 10/18/2008  . ANEMIA-IRON DEFICIENCY 04/30/2008  . COLONIC POLYPS, HX OF 04/30/2008   Past Medical History:  Diagnosis Date  . ALLERGIC RHINITIS 10/18/2008  . ANEMIA-IRON DEFICIENCY 04/30/2008  . ANEMIA-NOS 04/30/2008  . COLONIC POLYPS, HX OF 04/30/2008   Review of Systems  All other systems reviewed and are negative.    Vitals:   07/28/16 1041  BP: 112/70  Pulse: 84  Resp: 16  Weight: 161 lb (73 kg)  Height: 5\' 2"  (1.575 m)    Gen:  WNWF healthy female NAD Abdomen: soft, non-tender Groin:  no inguinal nodes palpated  Pelvic exam: Vulva:  normal female genitalia Vagina:  normal vagina Cervix:  Non-tender, Negative CMT, no lesions or redness. Uterus:  normal shape, position and consistency, retroverted, retroflexed, mobile   Procedure:  Speculum reinserted.  Cervix visualized.  Then cervix cleansed with Betadine x 3.  Paracervical block was not placed.  Single toothed tenaculum applied to  anterior lip of cervix without difficulty.  Uterus sounded to 9cm.  IUD package was opened.  IUD and introducer passed to fundus and then withdrawn slightly before IUD was passed into endometrial cavity.  Introducer removed.  Strings cut to 2cm.  Tenaculum removed from cervix.  Minimal bleeding noted.  Pt tolerated the procedure well.  All instruments removed from vagina.  Lot number: [id number].  Expiration:  4/20.   A:  Insertion of Mirena IUD Contraception desires  P:  Return for recheck 6-8 weeks Pt aware to call for any concerns Pt aware removal due no later than 07/28/2021.  IUD card given to pt.

## 2016-09-09 ENCOUNTER — Ambulatory Visit (INDEPENDENT_AMBULATORY_CARE_PROVIDER_SITE_OTHER): Payer: BLUE CROSS/BLUE SHIELD | Admitting: Obstetrics & Gynecology

## 2016-09-09 VITALS — BP 100/60 | HR 80 | Resp 16 | Ht 62.0 in | Wt 165.0 lb

## 2016-09-09 DIAGNOSIS — Z30431 Encounter for routine checking of intrauterine contraceptive device: Secondary | ICD-10-CM

## 2016-09-09 DIAGNOSIS — N870 Mild cervical dysplasia: Secondary | ICD-10-CM

## 2016-09-09 NOTE — Progress Notes (Signed)
GYNECOLOGY  VISIT   HPI: 46 y.o. G80P2002 Married [place] female her for IUD recheck after Mirena placement on 07/28/16.  Pt's IUD was overdue for removal when seen by Dr. Edward Jolly this year.  Pt had LEEP 06/09/16(due to unsatisfactory pap smear) and IUD removed at that time.  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  Has follow-up with Dr. Edward Jolly already scheduled.  Pt did not have IUD placed at time of removal due to being overdue for removal.  Recommendation made for her to wait until cycle begin and call for placement.  Cycle started 07/25/16.  Dr. Edward Jolly was unavailable, so pt saw me for IUD placement  Since IUD placement, she reports having a short and light, 3 day cycle in December.  No pain and no pain with intercourse.  Has not had any spotting.  Doing well and very happy.  Reports her mother cycled into her later 18's.  Has questions about how likely this is for her to cycle this late.  D/W that timing of menopause related to many factors with genetics just being one of them.  Voiced understanding.    GYNECOLOGIC HISTORY: Patient's last menstrual period was 07/26/2016. Contraception: Mirena IUD Menopausal hormone therapy: None  Patient Active Problem List   Diagnosis Date Noted  . SI (sacroiliac) joint dysfunction 11/27/2013  . Nonallopathic lesion of sacral region 11/27/2013  . Cluster headache 02/10/2012  . Preventative health care 08/09/2011  . ALLERGIC RHINITIS 10/18/2008  . FATIGUE 10/18/2008  . ANEMIA-IRON DEFICIENCY 04/30/2008  . COLONIC POLYPS, HX OF 04/30/2008    Past Medical History:  Diagnosis Date  . ALLERGIC RHINITIS 10/18/2008  . ANEMIA-IRON DEFICIENCY 04/30/2008  . ANEMIA-NOS 04/30/2008  . COLONIC POLYPS, HX OF 04/30/2008    Past Surgical History:  Procedure Laterality Date  . CESAREAN SECTION  x2  . INTRAUTERINE DEVICE INSERTION  2010   Mirena & removed during leep procedure    MEDS:  Reviewed in EPIC and UTD  ALLERGIES: Oxycodone  Family  History  Problem Relation Age of Onset  . Hypertension Mother   . Clotting disorder Father     SH:  Married, non smoker  Review of Systems  All other systems reviewed and are negative.   PHYSICAL EXAMINATION:    BP 100/60 (BP Location: Right Arm, Patient Position: Sitting, Cuff Size: Normal)   Pulse 80   Resp 16   Ht 5\' 2"  (1.575 m)   Wt 165 lb (74.8 kg)   LMP 07/26/2016   BMI 30.18 kg/m     General appearance: alert, cooperative and appears stated age  Pelvic: External genitalia:  no lesions              Urethra:  normal appearing urethra with no masses, tenderness or lesions              Bartholins and Skenes: normal                 Vagina: normal appearing vagina with normal color and discharge, no lesions              Cervix: no lesions and 2cm IUD string noted              Bimanual Exam:  Uterus:  normal size, contour, position, consistency, mobility, non-tender              Adnexa: no mass, fullness, tenderness              Anus: no lesions  Chaperone was present for exam.  Assessment: S/p Mirena IUD placement S/p LEEP with cin1 and negative margins  Plan: Return to see Dr. Edward JollySilva in July for follow up pap and HR HPV testing.  H/O LEEP 06/09/16.

## 2016-09-10 ENCOUNTER — Encounter: Payer: Self-pay | Admitting: Obstetrics & Gynecology

## 2016-12-10 ENCOUNTER — Telehealth: Payer: Self-pay | Admitting: Obstetrics and Gynecology

## 2016-12-10 ENCOUNTER — Telehealth: Payer: Self-pay | Admitting: Internal Medicine

## 2016-12-10 NOTE — Telephone Encounter (Signed)
Spoke with patient. Patient is asking about weight loss medication Qsymia and if this may be something Dr. Edward Jolly can or will prescribe? Recommended patient to f/u with pcp, patient states no pcp, usually sees Dr. Edward Jolly. Patient states will come in for OV to discuss if this is something she will prescribe. Advised patient that would review with Dr. Edward Jolly and return call with recommendations, patient is agreeable.  Dr. Edward Jolly, please review and advise?

## 2016-12-10 NOTE — Telephone Encounter (Signed)
Patient wants to speak with the nurse no information given. °

## 2016-12-10 NOTE — Telephone Encounter (Signed)
Patient will need to see a PCP for this prescription.

## 2016-12-10 NOTE — Telephone Encounter (Signed)
Pt called to see if she would be a good candidate for Qsymia? Please advise.

## 2016-12-13 NOTE — Telephone Encounter (Signed)
Spoke with patient, advised as seen below per Dr. Edward Jolly. Patient verbalizes understanding and is thankful for return call.  Routing to provider for final review. Patient is agreeable to disposition. Will close encounter.

## 2016-12-14 MED ORDER — PHENTERMINE-TOPIRAMATE ER 15-92 MG PO CP24: ORAL_CAPSULE | ORAL | 5 refills | Status: DC

## 2016-12-14 NOTE — Telephone Encounter (Signed)
LM with pt with MD response.

## 2016-12-14 NOTE — Telephone Encounter (Signed)
Done hardcopy to Shirron  

## 2016-12-14 NOTE — Telephone Encounter (Signed)
Pt called in and found out that her ins will not cover it and she is ok with it and she is going to pay for it out of pocket.  She knows the cost and she is ok with it

## 2016-12-14 NOTE — Telephone Encounter (Signed)
Yes, but she should ask her pharmacist about the cost and whether it is covered by insurance  We have not had good luck with Prior Authorizations for this, so it is very rare that I have seen a pt take this

## 2016-12-15 NOTE — Telephone Encounter (Signed)
Faxed

## 2016-12-17 ENCOUNTER — Telehealth: Payer: Self-pay

## 2016-12-17 NOTE — Telephone Encounter (Signed)
PA was denied on 12/17/16 Key: [id number] Left pt a detailed VM.

## 2017-03-10 ENCOUNTER — Ambulatory Visit: Payer: BLUE CROSS/BLUE SHIELD | Admitting: Obstetrics and Gynecology

## 2017-03-10 NOTE — Progress Notes (Deleted)
46 y.o. 382P2002 Married [name] here for annual exam.    PCP:     No LMP recorded. Patient is not currently having periods (Reason: IUD).           Sexually active: {yes no:314532}  The current method of family planning is {contraception:315051}.    Exercising: {yes no:314532}  {types:19826} Smoker:  no  Health Maintenance: Pap:  03/08/16 LGSIL with Negative HR HPV  08-09-14 ASCUS:Neg History of abnormal Pap:  Yes, Hx LGSIL 2017 and 2012 with colposcopy & bx; LEEP done 06/09/16 showing LGSIL with negative margins MMG:  10/29/14 diagnostic LEFT: BIRADS 1 negative/density c Colonoscopy:  2012 polyps with Dr. Thelma CompMann;next due 2017 BMD:   n/a  Result  n/a TDaP:  *** Gardasil:   N/A HIV: Hep C: Screening Labs:  Hb today: ***, Urine today: ***   reports that she has never smoked. She has never used smokeless tobacco. She reports that she does not drink alcohol or use drugs.  Past Medical History:  Diagnosis Date  . ALLERGIC RHINITIS 10/18/2008  . ANEMIA-IRON DEFICIENCY 04/30/2008  . ANEMIA-NOS 04/30/2008  . COLONIC POLYPS, HX OF 04/30/2008    Past Surgical History:  Procedure Laterality Date  . CESAREAN SECTION  x2  . INTRAUTERINE DEVICE INSERTION  2010   Mirena & removed during leep procedure    Current Outpatient Prescriptions  Medication Sig Dispense Refill  . Loratadine-Pseudoephedrine (CLARITIN-D 12 HOUR PO) Take 1 tablet by mouth as needed.    . Phentermine-Topiramate (QSYMIA) 15-92 MG CP24 1 tab by mouth daily 30 capsule 5   No current facility-administered medications for this visit.     Family History  Problem Relation Age of Onset  . Hypertension Mother   . Clotting disorder Father     ROS:  Pertinent items are noted in HPI.  Otherwise, a comprehensive ROS was negative.  Exam:   There were no vitals taken for this visit.    General appearance: alert, cooperative and appears stated age Head: Normocephalic, without obvious abnormality,  atraumatic Neck: no adenopathy, supple, symmetrical, trachea midline and thyroid normal to inspection and palpation Lungs: clear to auscultation bilaterally Breasts: normal appearance, no masses or tenderness, No nipple retraction or dimpling, No nipple discharge or bleeding, No axillary or supraclavicular adenopathy Heart: regular rate and rhythm Abdomen: soft, non-tender; no masses, no organomegaly Extremities: extremities normal, atraumatic, no cyanosis or edema Skin: Skin color, texture, turgor normal. No rashes or lesions Lymph nodes: Cervical, supraclavicular, and axillary nodes normal. No abnormal inguinal nodes palpated Neurologic: Grossly normal  Pelvic: External genitalia:  no lesions              Urethra:  normal appearing urethra with no masses, tenderness or lesions              Bartholins and Skenes: normal                 Vagina: normal appearing vagina with normal color and discharge, no lesions              Cervix: no lesions              Pap taken: {yes no:314532} Bimanual Exam:  Uterus:  normal size, contour, position, consistency, mobility, non-tender              Adnexa: no mass, fullness, tenderness              Rectal exam: {yes no:314532}.  Confirms.  Anus:  normal sphincter tone, no lesions  Chaperone was present for exam.  Assessment:   Well woman visit with normal exam.   Plan: Mammogram screening discussed. Recommended self breast awareness. Pap and HR HPV as above. Guidelines for Calcium, Vitamin D, regular exercise program including cardiovascular and weight bearing exercise.   Follow up annually and prn.   Additional counseling given.  {yes [id number]. _______ minutes face to face time of which over 50% was spent in counseling.    After visit summary provided.

## 2017-03-14 ENCOUNTER — Ambulatory Visit: Payer: BLUE CROSS/BLUE SHIELD | Admitting: Obstetrics and Gynecology

## 2017-03-14 ENCOUNTER — Telehealth: Payer: Self-pay | Admitting: Obstetrics and Gynecology

## 2017-03-14 ENCOUNTER — Encounter: Payer: Self-pay | Admitting: Obstetrics and Gynecology

## 2017-03-14 NOTE — Telephone Encounter (Signed)
Patient called to confirm her appointment date for her AEX was not for this morning. Her appointment last week was cancelled due to a surgery conflict and she said she went back and forth between the dates and could not remember which one she finally picked. I confirmed the appointment was scheduled for this morning and not next Monday. The patient apologized for the "mix up" and rescheduled to the next available appointment on 04/08/17 at 10:00 with Dr. Edward JollySilva. Routing to provider for FYI only.

## 2017-04-08 ENCOUNTER — Encounter: Payer: Self-pay | Admitting: Obstetrics and Gynecology

## 2017-04-08 ENCOUNTER — Other Ambulatory Visit (HOSPITAL_COMMUNITY)
Admission: RE | Admit: 2017-04-08 | Discharge: 2017-04-08 | Disposition: A | Discharge status: Home / Self Care | Payer: BLUE CROSS/BLUE SHIELD | Source: Ambulatory Visit | Attending: Obstetrics and Gynecology | Admitting: Obstetrics and Gynecology

## 2017-04-08 ENCOUNTER — Ambulatory Visit (INDEPENDENT_AMBULATORY_CARE_PROVIDER_SITE_OTHER): Payer: BLUE CROSS/BLUE SHIELD | Admitting: Obstetrics and Gynecology

## 2017-04-08 VITALS — BP 102/70 | HR 72 | Resp 16 | Ht 61.75 in | Wt 160.0 lb

## 2017-04-08 DIAGNOSIS — R350 Frequency of micturition: Secondary | ICD-10-CM

## 2017-04-08 DIAGNOSIS — Z975 Presence of (intrauterine) contraceptive device: Secondary | ICD-10-CM | POA: Diagnosis not present

## 2017-04-08 DIAGNOSIS — Z01419 Encounter for gynecological examination (general) (routine) without abnormal findings: Secondary | ICD-10-CM | POA: Diagnosis present

## 2017-04-08 DIAGNOSIS — Z23 Encounter for immunization: Secondary | ICD-10-CM | POA: Diagnosis not present

## 2017-04-08 LAB — POCT URINALYSIS DIPSTICK
BILIRUBIN UA: NEGATIVE
Glucose, UA: NEGATIVE
KETONES UA: NEGATIVE
Nitrite, UA: NEGATIVE
PH UA: 5 (ref 5.0–8.0)
PROTEIN UA: NEGATIVE
Urobilinogen, UA: 0.2 E.U./dL

## 2017-04-08 MED ORDER — SULFAMETHOXAZOLE-TRIMETHOPRIM 800-160 MG PO TABS: 1.0000 | ORAL_TABLET | Freq: Two times a day (BID) | ORAL | 0 refills | Status: DC

## 2017-04-08 NOTE — Progress Notes (Signed)
46 y.o. [id number] Married [name] here for annual exam. Patient complains of having frequency of urination for about 2 days. Noticed after intercourse.   Using some Estrace vaginal cream.  Vaginal dryness and discomfort.   No menses with Mirena IUD.  PCP: Dr. Oliver BarreJames John   No LMP recorded. Patient is not currently having periods (Reason: IUD).           Sexually active: Yes.    The current method of family planning is Mirena IUD inserted 07/28/16.    Exercising: No.  The patient does not participate in regular exercise at present. Smoker:  no  Health Maintenance: Pap:  Pap 03/08/16 showing LGSIL and negative HR HPV. Colposcopy 04-16-16 unsatisfactory.  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 LEEP 06/09/16 - LEEP specimen showing LGSIL. Margins were negative Pap 08/09/14 ASCUS, negative HR HPV.  Colpo 2012 - LGSIL. No tx. History of abnormal Pap:  Yes, see above MMG:  Will call for report -- per patient done in 2017.  No report in EPIC.  [facility]. Colonoscopy:  2012 polyps with Dr. Loreta AveMann; due 2017 -- patient will call GI BMD:   n/a  Result  n/a TDaP:   Unsure.  HIV: done with pregnancy Hep C: done with pregnancy Screening Labs:  PCP Urine: Trace WBC, Moderate amount of RBC, 5.0 pH   reports that she has never smoked. She has never used smokeless tobacco. She reports that she does not drink alcohol or use drugs.  Past Medical History:  Diagnosis Date  . ALLERGIC RHINITIS 10/18/2008  . ANEMIA-IRON DEFICIENCY 04/30/2008  . ANEMIA-NOS 04/30/2008  . COLONIC POLYPS, HX OF 04/30/2008    Past Surgical History:  Procedure Laterality Date  . CESAREAN SECTION  x2  . INTRAUTERINE DEVICE INSERTION  2010   Mirena & removed during leep procedure    Current Outpatient Prescriptions  Medication Sig Dispense Refill  . Loratadine-Pseudoephedrine (CLARITIN-D 12 HOUR PO) Take 1 tablet by mouth as needed.     No current facility-administered medications for  this visit.     Family History  Problem Relation Age of Onset  . Hypertension Mother   . Clotting disorder Father     ROS:  Pertinent items are noted in HPI.  Otherwise, a comprehensive ROS was negative.  Exam:   BP 102/70 (BP Location: Right Arm, Patient Position: Sitting, Cuff Size: Normal)   Pulse 72   Resp 16   Ht 5' 1.75" (1.568 m)   Wt 160 lb (72.6 kg)   BMI 29.50 kg/m     General appearance: alert, cooperative and appears stated age Head: Normocephalic, without obvious abnormality, atraumatic Neck: no adenopathy, supple, symmetrical, trachea midline and thyroid normal to inspection and palpation Lungs: clear to auscultation bilaterally Breasts: normal appearance, no masses or tenderness, No nipple retraction or dimpling, No nipple discharge or bleeding, No axillary or supraclavicular adenopathy Heart: regular rate and rhythm Abdomen: soft, non-tender; no masses, no organomegaly Extremities: extremities normal, atraumatic, no cyanosis or edema Skin: Skin color, texture, turgor normal. No rashes or lesions Lymph nodes: Cervical, supraclavicular, and axillary nodes normal. No abnormal inguinal nodes palpated Neurologic: Grossly normal  Pelvic: External genitalia:  no lesions              Urethra:  normal appearing urethra with no masses, tenderness or lesions              Bartholins and Skenes: normal  Vagina: normal appearing vagina with normal color and discharge, no lesions              Cervix: no lesions.  IUD strings noted.              Pap taken: Yes.   Bimanual Exam:  Uterus:  normal size, contour, position, consistency, mobility, non-tender              Adnexa: no mass, fullness, tenderness              Rectal exam: Declined.  Chaperone was present for exam.  Assessment:   Well woman visit with normal exam. Status post LEEP in 2017 showing LGSIL.  Urinary frequency. Abnormal urine.  Vaginal atrophy symptoms.  Mirena IUD.   Plan: Mammogram  screening discussed.  [facility] Recommended self breast awareness. Pap and HR HPV as above. Urine micro and cx. Bactrim DS po bid x 3 days. Will refill Estrace cream after mammogram back and normal.  I did discuss possible increased risk of breast cancer.  TDap.  Declines labs today but will make an appointment to return for this.  She will call Dr. Loreta AveMann for her colonoscopy.  Follow up annually and prn.   After visit summary provided.

## 2017-04-09 LAB — URINALYSIS, MICROSCOPIC ONLY: CASTS: NONE SEEN /LPF

## 2017-04-10 LAB — URINE CULTURE

## 2017-04-12 ENCOUNTER — Telehealth: Payer: Self-pay | Admitting: *Deleted

## 2017-04-12 DIAGNOSIS — R87612 Low grade squamous intraepithelial lesion on cytologic smear of cervix (LGSIL): Secondary | ICD-10-CM

## 2017-04-12 LAB — CYTOLOGY - PAP
Adequacy: ABSENT — AB
HPV (WINDOPATH): NOT DETECTED

## 2017-04-12 NOTE — Telephone Encounter (Signed)
-----   Message from Sarah SallesBrook E Amundson C Silva, MD sent at 04/11/2017  8:27 PM EDT ----- Please inform patient that her final UC showed E Coli sensitive to Bactrim DS.  I hope she is feeling better!

## 2017-04-12 NOTE — Telephone Encounter (Signed)
Left message to call Keane Martelli at [phone].  

## 2017-04-15 NOTE — Telephone Encounter (Signed)
Routing to Triage to schedule colposcopy.

## 2017-04-15 NOTE — Telephone Encounter (Signed)
-----   Message from Patton SallesBrook E Amundson C Silva, MD sent at 04/14/2017  6:04 PM EDT ----- Please inform patient of her pap showing LGSIL and negative HR HPV.  She has a hx of a LEEP last year and LGSIL.  She needs a colposcopy again with me. Please schedule this and send to precert.

## 2017-04-15 NOTE — Telephone Encounter (Signed)
Left message to call Mallory Enriques at [phone].  

## 2017-04-21 NOTE — Telephone Encounter (Signed)
Patient returning your call.

## 2017-04-21 NOTE — Telephone Encounter (Signed)
Spoke with patient, advised of pap and urine culture results as seen below per Dr. Edward JollySilva. IUD for contraception. Patient request to schedule colpo week of 9/10, advised patient Dr. Edward JollySilva is out of the office, can schedule with covering provider, patient declined. Patient declined earlier appointments offered. Patient scheduled for colpo on 05/25/17 at 10am. Advised to take Motrin 800 mg with food and water one hour before procedure.  Patient verbalizes understanding and is agreeable.  Patient is agreeable to disposition. Will close encounter.  Cc: Harland DingwallSuzy Dixon, Braxton Feathersebecca Frahm

## 2017-04-21 NOTE — Telephone Encounter (Signed)
Call to mobile number, Left message to call Sarah LarssonJill at [phone] to review recent lab results.  Call to home number, no answer, phone rang and rang, no voicemail.

## 2017-05-19 NOTE — Progress Notes (Deleted)
Subjective:     Patient ID: Sarah Riley, female   DOB: 20-Jun-1971, 46 y.o.   MRN: [id number]  HPI  Patient here today for colposcopy. Pap 04-08-17 LGSIL:Neg HR HPV.  Pap history: 06-09-16 LEEP revealed LGSIL with neg.margins. Pap 03/08/16 showing LGSIL and negative HR HPV. Colposcopy 04-16-16 unsatisfactory.  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 Pap 08/09/14 ASCUS, negative HR HPV.  Colpo 2012 - LGSIL. No tx.   Review of Systems  LMP: Contraception: Mirena IUD inserted 07-28-16     Objective:   Physical Exam     Assessment:     ***    Plan:     ***

## 2017-05-24 ENCOUNTER — Telehealth: Payer: Self-pay | Admitting: Obstetrics and Gynecology

## 2017-05-24 NOTE — Telephone Encounter (Signed)
Attempted to reach patient at number provided [phone], there was no answer and recording states that there is no voicemail box set up.

## 2017-05-24 NOTE — Telephone Encounter (Signed)
Patient canceled her Colpo appointment tomorrow and would like to reschedule. Patient said " I have a meeting at work." Patient would like to reschedule.

## 2017-05-25 ENCOUNTER — Ambulatory Visit: Payer: Self-pay

## 2017-05-25 ENCOUNTER — Ambulatory Visit: Payer: Self-pay | Admitting: Obstetrics and Gynecology

## 2017-05-27 NOTE — Telephone Encounter (Signed)
Attempted to reach patient at [phone], there was no answer and recording states that the voicemail box is not set up.

## 2017-05-31 NOTE — Telephone Encounter (Signed)
Spoke with patient. Patient has an IUD for contraception. Not having cycles with IUD. Colposcopy rescheduled for 06/09/2017 at 3 pm with Dr.Silva. Instructions given. Motrin 800 mg po x , one hour before appointment with food. Make sure to eat a meal before appointment and drink plenty of fluids. Patient verbalized understanding and will call to reschedule if will be on menses or has any concerns regarding pregnancy. Advised will need to cancel within 24 hours or will have $150.00 late cancellation fee placed to account. Patient agreeable and verbalized understanding of all instructions. Patient would like to review benefits for appointment. Advised would send a message to insurance and billing for return call to discuss. Patient is agreeable.  Cc: Harland Dingwall  Routing to provider for final review. Patient agreeable to disposition. Will close encounter.

## 2017-06-08 NOTE — Progress Notes (Addendum)
Subjective:     Patient ID: Sarah Riley, female   DOB: 1970/10/21, 46 y.o.   MRN: [id number]  HPI  Patient here today for colposcopy. Pap 04-08-17 LGSIL:Neg HR HPV.  Pap history: Pap 04-08-17 LGSIL:Neg HR HPV. LEEP 06-09-16 - specimen showing LGSIL. Margins neg. 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  Pap 03-08-16 LGSIL:Neg HR HPV Pap 08-09-14 ASCUS, Neg HR HPV Colpo 2012 - LGSIL but no treatment  Review of Systems  LMP: IUD Contraception: Mirena IUD inserted 07-28-16 [id number]     Objective:   Physical Exam Colposcopy performed after consent obtained.  3% acetic acid placed.  White light and green filter used.  No lesions seen. IUD strings noted.  Colposcopy satisfactory with endocervical speculum. ECC performed and sent to pathology.  Minor bleeding noted.  No complications.     Assessment:     Hx LGSIL and negative HR HPV.  Status post LEEP.     Plan:     Follow up ECC. Anticipated pap and HR HPV testing in one year.  Will schedule mammogram    After visit summary to patient.

## 2017-06-09 ENCOUNTER — Other Ambulatory Visit: Payer: Self-pay | Admitting: Obstetrics and Gynecology

## 2017-06-09 ENCOUNTER — Encounter: Payer: Self-pay | Admitting: Obstetrics and Gynecology

## 2017-06-09 ENCOUNTER — Ambulatory Visit (INDEPENDENT_AMBULATORY_CARE_PROVIDER_SITE_OTHER): Payer: BLUE CROSS/BLUE SHIELD | Admitting: Obstetrics and Gynecology

## 2017-06-09 VITALS — BP 116/62 | HR 68 | Resp 16 | Wt 162.0 lb

## 2017-06-09 DIAGNOSIS — R87612 Low grade squamous intraepithelial lesion on cytologic smear of cervix (LGSIL): Secondary | ICD-10-CM | POA: Diagnosis not present

## 2017-06-09 DIAGNOSIS — Z01812 Encounter for preprocedural laboratory examination: Secondary | ICD-10-CM

## 2017-06-09 DIAGNOSIS — N72 Inflammatory disease of cervix uteri: Secondary | ICD-10-CM | POA: Diagnosis not present

## 2017-06-09 DIAGNOSIS — Z1231 Encounter for screening mammogram for malignant neoplasm of breast: Secondary | ICD-10-CM

## 2017-06-09 LAB — POCT URINE PREGNANCY: Preg Test, Ur: NEGATIVE

## 2017-06-09 NOTE — Patient Instructions (Signed)

## 2017-06-09 NOTE — Progress Notes (Signed)
Scheduled patient while in office for bilateral screening mammogram on 06/23/2017 at 3 pm at the [facility]. Patient is agreeable to date and time.

## 2017-06-23 ENCOUNTER — Ambulatory Visit: Payer: BLUE CROSS/BLUE SHIELD

## 2018-04-14 ENCOUNTER — Ambulatory Visit: Payer: BLUE CROSS/BLUE SHIELD | Admitting: Obstetrics and Gynecology

## 2018-05-17 NOTE — Progress Notes (Signed)
47 y.o. G63P2002 Married [place] female here for annual exam.    No cycles with Mirena.   Daughter is in [place]. [place].  Son starting high school.   Declines blood work.   PCP: No PCP    Patient's last menstrual period was 07/28/2016.           Sexually active: Yes.    The current method of family planning is Mirena IUD inserted 07/28/16.    Exercising: No.  The patient does not participate in regular exercise at present. Smoker:  no  Health Maintenance: Pap:  04/08/17 LGSIL:Neg HR HPV.  Colpo 06/09/17 - ECC benign.  History of abnormal Pap:   Pap 03/08/16 showing LGSIL and negative HR HPV. Colposcopy 04-16-16 unsatisfactory. 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 LEEP 06/09/16 - LEEP specimen showing LGSIL. Margins were negative Pap 08/09/14 ASCUS, negative HR HPV.  Colpo 2012 - LGSIL. No tx. MMG:  09-03-15 Density C/asymmetry of Lt.Br.--10-29-14 Lt.Diag./Neg/superimposed normal fibroglandular tissue/biRads1:[facility] Colonoscopy:  2017 per patient normal BMD:   n/a  Result  n/a TDaP:  04/08/17 Gardasil:   no HIV: negative in pregnancy Hep C: never Screening Labs:  PCP   reports that she has never smoked. She has never used smokeless tobacco. She reports that she does not drink alcohol or use drugs.  Past Medical History:  Diagnosis Date  . ALLERGIC RHINITIS 10/18/2008  . ANEMIA-IRON DEFICIENCY 04/30/2008  . ANEMIA-NOS 04/30/2008  . COLONIC POLYPS, HX OF 04/30/2008    Past Surgical History:  Procedure Laterality Date  . CESAREAN SECTION  x2  . INTRAUTERINE DEVICE INSERTION  2010   Mirena & removed during leep procedure    Current Outpatient Medications  Medication Sig Dispense Refill  . levonorgestrel (MIRENA) 20 MCG/24HR IUD 1 each by Intrauterine route once.    . Loratadine-Pseudoephedrine (CLARITIN-D 12 HOUR PO) Take 1 tablet by mouth as needed.     No current facility-administered medications for this visit.     Family History  Problem  Relation Age of Onset  . Hypertension Mother   . Clotting disorder Father     Review of Systems  All other systems reviewed and are negative.   Exam:   BP 110/60 (BP Location: Right Arm, Patient Position: Sitting, Cuff Size: Normal)   Pulse 64   Resp 16   Ht 5\' 2"  (1.575 m)   Wt 173 lb (78.5 kg)   LMP 07/28/2016   BMI 31.64 kg/m     General appearance: alert, cooperative and appears stated age Head: Normocephalic, without obvious abnormality, atraumatic Neck: no adenopathy, supple, symmetrical, trachea midline and thyroid normal to inspection and palpation Lungs: clear to auscultation bilaterally Breasts: normal appearance, no masses or tenderness, No nipple retraction or dimpling, No nipple discharge or bleeding, No axillary or supraclavicular adenopathy Heart: regular rate and rhythm Abdomen: soft, non-tender; no masses, no organomegaly Extremities: extremities normal, atraumatic, no cyanosis or edema Skin: Skin color, texture, turgor normal. No rashes or lesions Lymph nodes: Cervical, supraclavicular, and axillary nodes normal. No abnormal inguinal nodes palpated Neurologic: Grossly normal  Pelvic: External genitalia:  no lesions              Urethra:  normal appearing urethra with no masses, tenderness or lesions              Bartholins and Skenes: normal                 Vagina: normal appearing vagina with normal color  and discharge, no lesions              Cervix: no lesions.  IUD strings noted.              Pap taken: Yes.   Bimanual Exam:  Uterus:  normal size, contour, position, consistency, mobility, non-tender              Adnexa: no mass, fullness, tenderness              Rectal exam: Yes.  .  Confirms.              Anus:  normal sphincter tone, no lesions  Chaperone was present for exam.  Assessment:   Well woman visit with normal exam. Hx LGSIL and neg HR HPV.  Mirena IUD patient.   Plan: Mammogram screening.  She will schedule.  I gave [facility] the [facility] for [facility]. Recommended self breast awareness. Pap and HR HPV as above. Guidelines for Calcium, Vitamin D, regular exercise program including cardiovascular and weight bearing exercise. IFOB. She will return for labs. Follow up annually and prn.   After visit summary provided.

## 2018-05-18 ENCOUNTER — Encounter: Payer: Self-pay | Admitting: Obstetrics and Gynecology

## 2018-05-18 ENCOUNTER — Ambulatory Visit: Payer: BLUE CROSS/BLUE SHIELD | Admitting: Obstetrics and Gynecology

## 2018-05-18 ENCOUNTER — Other Ambulatory Visit (HOSPITAL_COMMUNITY)
Admission: RE | Admit: 2018-05-18 | Discharge: 2018-05-18 | Disposition: A | Discharge status: Home / Self Care | Payer: BLUE CROSS/BLUE SHIELD | Source: Ambulatory Visit | Attending: Obstetrics and Gynecology | Admitting: Obstetrics and Gynecology

## 2018-05-18 ENCOUNTER — Other Ambulatory Visit: Payer: Self-pay

## 2018-05-18 VITALS — BP 110/60 | HR 64 | Resp 16 | Ht 62.0 in | Wt 173.0 lb

## 2018-05-18 DIAGNOSIS — Z01419 Encounter for gynecological examination (general) (routine) without abnormal findings: Secondary | ICD-10-CM | POA: Insufficient documentation

## 2018-05-18 DIAGNOSIS — Z1211 Encounter for screening for malignant neoplasm of colon: Secondary | ICD-10-CM | POA: Diagnosis not present

## 2018-05-18 DIAGNOSIS — N870 Mild cervical dysplasia: Secondary | ICD-10-CM | POA: Insufficient documentation

## 2018-05-18 NOTE — Patient Instructions (Signed)

## 2018-05-23 LAB — CYTOLOGY - PAP: HPV (WINDOPATH): NOT DETECTED

## 2018-06-02 ENCOUNTER — Telehealth: Payer: Self-pay | Admitting: Emergency Medicine

## 2018-06-02 NOTE — Telephone Encounter (Signed)
-----   Message from Patton Salles, MD sent at 06/01/2018  8:26 AM EDT ----- Please report results to patient.  Pap is LGSIL with negative HR HPV.  She has had this same reading on paps in the past.  She had a colposcopy and LEEP for this in 2017.  Final pathology showed LGSIL.  She had a colposcopy for this is 2018.  Final pathology negative.  I am recommending cotesting in one year.  Please enter 08 recall.

## 2018-06-02 NOTE — Telephone Encounter (Signed)
08 Recall in. Patient has annual exam scheduled in 05/2019.   Call to patient. Results discussed.  She is very happy with plan and verbalizes understanding of importance of pap smear in one year for follow up.  Encounter closed.

## 2018-11-21 ENCOUNTER — Telehealth: Payer: Self-pay | Admitting: Gynecology

## 2018-11-21 MED ORDER — SULFAMETHOXAZOLE-TRIMETHOPRIM 800-160 MG PO TABS: 1.0000 | ORAL_TABLET | Freq: Two times a day (BID) | ORAL | 0 refills | Status: DC

## 2018-11-21 NOTE — Telephone Encounter (Signed)
On-call note: 2-day history of worsening dysuria, frequency and urgency.  No low back pain fever or chills.  History of UTI in the past and is familiar with symptoms.  Septra DS 1 p.o. twice daily x3 days.  OTC Azo.  Follow-up if symptoms persist, worsen or recur.

## 2019-05-28 ENCOUNTER — Ambulatory Visit: Payer: BLUE CROSS/BLUE SHIELD | Admitting: Obstetrics and Gynecology

## 2019-05-28 ENCOUNTER — Encounter: Payer: Self-pay | Admitting: Obstetrics and Gynecology

## 2019-05-28 NOTE — Progress Notes (Deleted)
48 y.o. G52P2002 Married [place] American female here for annual exam.    PCP:     No LMP recorded. (Menstrual status: IUD).           Sexually active: {yes no:314532}  The current method of family planning is Mirena IUD inserted 07/28/16.    Exercising: {yes no:314532}  {types:19826} Smoker:  no  Health Maintenance: Pap:  05/18/18 LGSIL:Neg HR HPV History of abnormal Pap:  04/08/17 LGSIL:Neg HR HPV.  Colpo 06/09/17 - ECC benign.Pap 03/08/16 showing LGSIL and negative HR HPV. Colposcopy 04-16-16 unsatisfactory. 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 Margins were negative Pap 08/09/14 ASCUS, negative HR HPV.  Colpo 2012 - LGSIL. No tx. MMG:  *** Colonoscopy:  2017 Normal BMD:   n/a  Result  n/a TDaP:  04/08/17  Gardasil:   no HIV: negative in pregnancy  Hep C: never Screening Labs: PCP   reports that she has never smoked. She has never used smokeless tobacco. She reports that she does not drink alcohol or use drugs.  Past Medical History:  Diagnosis Date  . ALLERGIC RHINITIS 10/18/2008  . ANEMIA-IRON DEFICIENCY 04/30/2008  . ANEMIA-NOS 04/30/2008  . COLONIC POLYPS, HX OF 04/30/2008    Past Surgical History:  Procedure Laterality Date  . CESAREAN SECTION  x2  . INTRAUTERINE DEVICE INSERTION  2010   Mirena & removed during leep procedure    Current Outpatient Medications  Medication Sig Dispense Refill  . levonorgestrel (MIRENA) 20 MCG/24HR IUD 1 each by Intrauterine route once.    . Loratadine-Pseudoephedrine (CLARITIN-D 12 HOUR PO) Take 1 tablet by mouth as needed.    . sulfamethoxazole-trimethoprim (BACTRIM DS,SEPTRA DS) 800-160 MG tablet Take 1 tablet by mouth 2 (two) times daily. 6 tablet 0   No current facility-administered medications for this visit.     Family History  Problem Relation Age of Onset  . Hypertension Mother   . Clotting disorder Father     Review of Systems  Exam:   There were no vitals taken  for this visit.    General appearance: alert, cooperative and appears stated age Head: normocephalic, without obvious abnormality, atraumatic Neck: no adenopathy, supple, symmetrical, trachea midline and thyroid normal to inspection and palpation Lungs: clear to auscultation bilaterally Breasts: normal appearance, no masses or tenderness, No nipple retraction or dimpling, No nipple discharge or bleeding, No axillary adenopathy Heart: regular rate and rhythm Abdomen: soft, non-tender; no masses, no organomegaly Extremities: extremities normal, atraumatic, no cyanosis or edema Skin: skin color, texture, turgor normal. No rashes or lesions Lymph nodes: cervical, supraclavicular, and axillary nodes normal. Neurologic: grossly normal  Pelvic: External genitalia:  no lesions              No abnormal inguinal nodes palpated.              Urethra:  normal appearing urethra with no masses, tenderness or lesions              Bartholins and Skenes: normal                 Vagina: normal appearing vagina with normal color and discharge, no lesions              Cervix: no lesions              Pap taken: {yes no:314532} Bimanual Exam:  Uterus:  normal size, contour, position, consistency, mobility, non-tender  Adnexa: no mass, fullness, tenderness              Rectal exam: {yes no:314532}.  Confirms.              Anus:  normal sphincter tone, no lesions  Chaperone was present for exam.  Assessment:   Well woman visit with normal exam.   Plan: Mammogram screening discussed. Self breast awareness reviewed. Pap and HR HPV as above. Guidelines for Calcium, Vitamin D, regular exercise program including cardiovascular and weight bearing exercise.   Follow up annually and prn.   Additional counseling given.  {yes [id number]. _______ minutes face to face time of which over 50% was spent in counseling.    After visit summary provided.

## 2019-05-29 ENCOUNTER — Encounter: Payer: Self-pay | Admitting: Gynecology

## 2019-05-31 ENCOUNTER — Other Ambulatory Visit: Payer: Self-pay

## 2019-06-04 ENCOUNTER — Ambulatory Visit: Payer: BC Managed Care – PPO | Admitting: Obstetrics and Gynecology

## 2019-07-03 ENCOUNTER — Telehealth: Payer: Self-pay | Admitting: *Deleted

## 2019-07-03 NOTE — Telephone Encounter (Signed)
Patient called from answering service.  Reports symptoms of " typical UTI" for last three days. Sensation to void and discomfort at end of urinary flow.  Denies back pain or fever.  Last annual 05-18-18 and scheduled for 07-18-19. Advised needs office visit for evaluation. Appointment scheduled for 330 tomorrow with Evalee Mutton, CNM  Routing to Dr Quincy Simmonds. Encounter closed.   CC; Evalee Mutton, CNM

## 2019-07-04 ENCOUNTER — Encounter: Payer: Self-pay | Admitting: Certified Nurse Midwife

## 2019-07-04 ENCOUNTER — Other Ambulatory Visit: Payer: Self-pay

## 2019-07-04 ENCOUNTER — Ambulatory Visit: Payer: BC Managed Care – PPO | Admitting: Certified Nurse Midwife

## 2019-07-04 VITALS — BP 104/68 | HR 68 | Temp 97.2°F | Resp 16 | Wt 171.0 lb

## 2019-07-04 DIAGNOSIS — N898 Other specified noninflammatory disorders of vagina: Secondary | ICD-10-CM | POA: Diagnosis not present

## 2019-07-04 DIAGNOSIS — N390 Urinary tract infection, site not specified: Secondary | ICD-10-CM | POA: Diagnosis not present

## 2019-07-04 DIAGNOSIS — R319 Hematuria, unspecified: Secondary | ICD-10-CM

## 2019-07-04 LAB — POCT URINALYSIS DIPSTICK
Bilirubin, UA: NEGATIVE
Glucose, UA: NEGATIVE
Ketones, UA: NEGATIVE
Leukocytes, UA: NEGATIVE
Nitrite, UA: NEGATIVE
Protein, UA: NEGATIVE
Urobilinogen, UA: >=8 E.U./dL — AB
pH, UA: 5 (ref 5.0–8.0)

## 2019-07-04 MED ORDER — NITROFURANTOIN MONOHYD MACRO 100 MG PO CAPS: 100.0000 mg | ORAL_CAPSULE | Freq: Two times a day (BID) | ORAL | 0 refills | Status: DC

## 2019-07-04 NOTE — Progress Notes (Signed)
48 y.o. Married [place] American female [phone] here with complaint of UTI, with onset 5 days ago. Patient complaining of urinary frequency/urgency/ and pain with urination at end of stream .Patient denies fever, chills, nausea or back pain. No new personal products. Patient feels related  to sexual activity. Denies any vaginal symptoms.    Contraception is Mirena IUD. Occasional vaginal dryness, using coconut oil. Patient drinking adequate water intake.   Review of Systems  Constitutional: Negative.   HENT: Negative.   Eyes: Negative.   Respiratory: Negative.   Cardiovascular: Negative.   Gastrointestinal: Negative.   Genitourinary: Positive for dysuria.  Musculoskeletal: Negative.   Skin: Negative.   Neurological: Negative.   Endo/Heme/Allergies: Negative.   Psychiatric/Behavioral: Negative.     O: Healthy female WDWN Affect: Normal, orientation x 3 Skin : warm and dry CVAT: negative bilateral Abdomen: positive  for suprapubic tenderness  Pelvic exam: External genital area: normal, no lesions Bladder,Urethra tender, Urethral meatus: tender, red Vagina: normal vaginal discharge, normal appearance, but dryness noted at entrance to vagina Cervix: normal, non tender Uterus:normal,non tender Adnexa: normal non tender, no fullness or masses  poct urine-rbc 1+, urobilinogen 8 A: UTI Normal pelvic exam  P: Reviewed findings of UTI and need for treatment. XN:[id number] see order with instructions GUR:KYHCW micro, culture  Reviewed warning signs and symptoms of UTI and need to advise if occurring. Encouraged to limit soda, tea, and coffee and be sure to increase water intake. Discussed using vaginal moisture to help with dryness and decrease UTI occurrence. Discussed emptying bladder before and after sexual activity. Questions addressed. Consider post coital treatment if reoccurrence.   RV prn

## 2019-07-04 NOTE — Patient Instructions (Signed)
Urinary Tract Infection, Adult A urinary tract infection (UTI) is an infection of any part of the urinary tract. The urinary tract includes:  The kidneys.  The ureters.  The bladder.  The urethra. These organs make, store, and get rid of pee (urine) in the body. What are the causes? This is caused by germs (bacteria) in your genital area. These germs grow and cause swelling (inflammation) of your urinary tract. What increases the risk? You are more likely to develop this condition if:  You have a small, thin tube (catheter) to drain pee.  You cannot control when you pee or poop (incontinence).  You are female, and: ? You use these methods to prevent pregnancy: ? A medicine that kills sperm (spermicide). ? A device that blocks sperm (diaphragm). ? You have low levels of a female hormone (estrogen). ? You are pregnant.  You have genes that add to your risk.  You are sexually active.  You take antibiotic medicines.  You have trouble peeing because of: ? A prostate that is bigger than normal, if you are female. ? A blockage in the part of your body that drains pee from the bladder (urethra). ? A kidney stone. ? A nerve condition that affects your bladder (neurogenic bladder). ? Not getting enough to drink. ? Not peeing often enough.  You have other conditions, such as: ? Diabetes. ? A weak disease-fighting system (immune system). ? Sickle cell disease. ? Gout. ? Injury of the spine. What are the signs or symptoms? Symptoms of this condition include:  Needing to pee right away (urgently).  Peeing often.  Peeing small amounts often.  Pain or burning when peeing.  Blood in the pee.  Pee that smells bad or not like normal.  Trouble peeing.  Pee that is cloudy.  Fluid coming from the vagina, if you are female.  Pain in the belly or lower back. Other symptoms include:  Throwing up (vomiting).  No urge to eat.  Feeling mixed up (confused).  Being tired  and grouchy (irritable).  A fever.  Watery poop (diarrhea). How is this treated? This condition may be treated with:  Antibiotic medicine.  Other medicines.  Drinking enough water. Follow these instructions at home:  Medicines  Take over-the-counter and prescription medicines only as told by your doctor.  If you were prescribed an antibiotic medicine, take it as told by your doctor. Do not stop taking it even if you start to feel better. General instructions  Make sure you: ? Pee until your bladder is empty. ? Do not hold pee for a long time. ? Empty your bladder after sex. ? Wipe from front to back after pooping if you are a female. Use each tissue one time when you wipe.  Drink enough fluid to keep your pee pale yellow.  Keep all follow-up visits as told by your doctor. This is important. Contact a doctor if:  You do not get better after 1-2 days.  Your symptoms go away and then come back. Get help right away if:  You have very bad back pain.  You have very bad pain in your lower belly.  You have a fever.  You are sick to your stomach (nauseous).  You are throwing up. Summary  A urinary tract infection (UTI) is an infection of any part of the urinary tract.  This condition is caused by germs in your genital area.  There are many risk factors for a UTI. These include having a small, thin   tube to drain pee and not being able to control when you pee or poop.  Treatment includes antibiotic medicines for germs.  Drink enough fluid to keep your pee pale yellow. This information is not intended to replace advice given to you by your health care provider. Make sure you discuss any questions you have with your health care provider. Document Released: 02/09/2008 Document Revised: 08/10/2018 Document Reviewed: 03/02/2018 Elsevier Patient Education  2020 Elsevier Inc.  

## 2019-07-05 LAB — URINALYSIS, MICROSCOPIC ONLY: Casts: NONE SEEN /lpf

## 2019-07-07 LAB — URINE CULTURE

## 2019-07-09 ENCOUNTER — Telehealth: Payer: Self-pay

## 2019-07-09 NOTE — Telephone Encounter (Signed)
Patient is returning a call to Joy. °

## 2019-07-09 NOTE — Telephone Encounter (Signed)
-----   Message from Regina Eck, CNM sent at 07/08/2019  5:16 PM EST ----- Notify patient her urine culture showed E.Coli and the medication she is on is appropriate for treatment. Patient status?

## 2019-07-09 NOTE — Telephone Encounter (Signed)
Left message for call back.

## 2019-07-09 NOTE — Telephone Encounter (Signed)
Patient notified of results as written by provider 

## 2019-07-16 ENCOUNTER — Other Ambulatory Visit: Payer: Self-pay

## 2019-07-18 ENCOUNTER — Other Ambulatory Visit: Payer: Self-pay

## 2019-07-18 ENCOUNTER — Other Ambulatory Visit: Payer: Self-pay | Admitting: Obstetrics and Gynecology

## 2019-07-18 ENCOUNTER — Telehealth: Payer: Self-pay

## 2019-07-18 ENCOUNTER — Other Ambulatory Visit (HOSPITAL_COMMUNITY)
Admission: RE | Admit: 2019-07-18 | Discharge: 2019-07-18 | Disposition: A | Discharge status: Home / Self Care | Payer: BC Managed Care – PPO | Source: Ambulatory Visit | Attending: Obstetrics and Gynecology | Admitting: Obstetrics and Gynecology

## 2019-07-18 ENCOUNTER — Encounter: Payer: Self-pay | Admitting: Obstetrics and Gynecology

## 2019-07-18 ENCOUNTER — Ambulatory Visit (INDEPENDENT_AMBULATORY_CARE_PROVIDER_SITE_OTHER): Payer: BC Managed Care – PPO | Admitting: Obstetrics and Gynecology

## 2019-07-18 VITALS — BP 118/70 | HR 84 | Temp 97.6°F | Resp 16 | Ht 61.75 in | Wt 166.4 lb

## 2019-07-18 DIAGNOSIS — Z01419 Encounter for gynecological examination (general) (routine) without abnormal findings: Secondary | ICD-10-CM | POA: Diagnosis not present

## 2019-07-18 DIAGNOSIS — Z1231 Encounter for screening mammogram for malignant neoplasm of breast: Secondary | ICD-10-CM

## 2019-07-18 NOTE — Patient Instructions (Signed)

## 2019-07-18 NOTE — Progress Notes (Signed)
48 y.o. G37P2002 Married [place] American female here for annual exam.    Recent E Coli UTI tx with Macrobid.   Having more pain with intercourse.   Tried coconut oil.   PCP: None  No LMP recorded. (Menstrual status: IUD).     Period Cycle (Days): (no cycles with Mirena IUD)     Sexually active: Yes.    The current method of family planning is IUD-Mirena 07-28-16.    Exercising: Yes.    push-ups , jump rope, kickboxing, squats Smoker:  no  Health Maintenance: Pap: 05-18-18 LGSIL:Neg HR HPV,04/08/17 LGSIL:Neg HR HPV, 03-08-16 LGSIL:Neg HR HPV History of abnormal Pap:  Yes,04/08/17 LGSIL:Neg HR HPV.  Colpo 06/09/17 - ECC benign. Pap 03/08/16 showing LGSIL and negative HR HPV. Colposcopy 04-16-16 unsatisfactory. 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 Margins were negative Pap 08/09/14 ASCUS, negative HR HPV.  MMG: 08-23-14 lt.Br.poss. asymmetry, Rt.Neg/density C/on further evaluation focal asymmetry noted on the screening studywas due to superimposed normal fibroglandular tissue/BiRads1/screening 1 year.-- PATIENT knows needs to schedule Colonoscopy: 2017 normal;next ?10 years.   Dr. Collene Mares.  Done for abdominal pain.  BMD:   n/a  Result  n/a TDaP:  04-08-17 Gardasil:   no HIV:Neg in preg Hep C:no Screening Labs:  Today.   reports that she has never smoked. She has never used smokeless tobacco. She reports that she does not drink alcohol or use drugs.  Past Medical History:  Diagnosis Date  . ALLERGIC RHINITIS 10/18/2008  . ANEMIA-IRON DEFICIENCY 04/30/2008  . ANEMIA-NOS 04/30/2008  . COLONIC POLYPS, HX OF 04/30/2008    Past Surgical History:  Procedure Laterality Date  . CESAREAN SECTION  x2  . INTRAUTERINE DEVICE INSERTION     07-28-2016 inserted    Current Outpatient Medications  Medication Sig Dispense Refill  . levonorgestrel (MIRENA) 20 MCG/24HR IUD 1 each by Intrauterine route once.    . Loratadine-Pseudoephedrine (CLARITIN-D  12 HOUR PO) Take 1 tablet by mouth as needed.     No current facility-administered medications for this visit.     Family History  Problem Relation Age of Onset  . Hypertension Mother   . Clotting disorder Father     Review of Systems  All other systems reviewed and are negative.   Exam:   BP 118/70   Pulse 84   Temp 97.6 F (36.4 C) (Temporal)   Resp 16   Ht 5' 1.75" (1.568 m)   Wt 166 lb 6.4 oz (75.5 kg)   BMI 30.68 kg/m     General appearance: alert, cooperative and appears stated age Head: normocephalic, without obvious abnormality, atraumatic Neck: no adenopathy, supple, symmetrical, trachea midline and thyroid normal to inspection and palpation Lungs: clear to auscultation bilaterally Breasts: normal appearance, no masses or tenderness, No nipple retraction or dimpling, No nipple discharge or bleeding, No axillary adenopathy Heart: regular rate and rhythm Abdomen: soft, non-tender; no masses, no organomegaly Extremities: extremities normal, atraumatic, no cyanosis or edema Skin: skin color, texture, turgor normal. No rashes or lesions Lymph nodes: cervical, supraclavicular, and axillary nodes normal. Neurologic: grossly normal  Pelvic: External genitalia:  no lesions              No abnormal inguinal nodes palpated.              Urethra:  normal appearing urethra with no masses, tenderness or lesions              Bartholins and Skenes: normal  Vagina: normal appearing vagina with normal color and discharge, no lesions              Cervix: no lesions.  IUD strings noted.              Pap taken: Yes.   Bimanual Exam:  Uterus:  normal size, contour, position, consistency, mobility, non-tender              Adnexa: no mass, fullness, tenderness              Rectal exam: Yes.  .  Confirms.              Anus:  normal sphincter tone, no lesions  Chaperone was present for exam.  Assessment:   Well woman visit with normal exam. Hx LGSIL.  Status post  LEEP.  Mirena IUD. Vaginal atrophy.  Hx UTI.  Plan: Mammogram screening discussed.  Will schedule for patient.  Self breast awareness reviewed. Pap and HR HPV as above. Guidelines for Calcium, Vitamin D, regular exercise program including cardiovascular and weight bearing exercise. Routine labs.  Will prescribe vaginal estrogen cream after mammogram back and normal.  I discussed potential effect on breast cancer.  Follow up annually and prn.   After visit summary provided.

## 2019-07-18 NOTE — Telephone Encounter (Signed)
Called [facility] to schedule pt 3D MMG screening per Dr Elza Rafter order. Pt scheduled 09/10/19 at 4:40pm but told by Anderson Malta at [facility] to call every morning at 8:45am to check on cancellations to get in earlier appt. Pt agreeable.  Routing to provider for final review.  Will close encounter.

## 2019-07-19 ENCOUNTER — Ambulatory Visit
Admission: RE | Admit: 2019-07-19 | Discharge: 2019-07-19 | Disposition: A | Discharge status: Home / Self Care | Payer: BC Managed Care – PPO | Source: Ambulatory Visit | Attending: Obstetrics and Gynecology | Admitting: Obstetrics and Gynecology

## 2019-07-19 ENCOUNTER — Telehealth: Payer: Self-pay | Admitting: Obstetrics and Gynecology

## 2019-07-19 DIAGNOSIS — Z1231 Encounter for screening mammogram for malignant neoplasm of breast: Secondary | ICD-10-CM | POA: Diagnosis not present

## 2019-07-19 LAB — COMPREHENSIVE METABOLIC PANEL
ALT: 11 IU/L (ref 0–32)
AST: 16 IU/L (ref 0–40)
Albumin/Globulin Ratio: 1.2 (ref 1.2–2.2)
Albumin: 4 g/dL (ref 3.8–4.8)
Alkaline Phosphatase: 61 IU/L (ref 39–117)
BUN/Creatinine Ratio: 13 (ref 9–23)
BUN: 11 mg/dL (ref 6–24)
Bilirubin Total: 0.4 mg/dL (ref 0.0–1.2)
CO2: 25 mmol/L (ref 20–29)
Calcium: 9.4 mg/dL (ref 8.7–10.2)
Chloride: 105 mmol/L (ref 96–106)
Creatinine, Ser: 0.86 mg/dL (ref 0.57–1.00)
GFR calc Af Amer: 92 mL/min/{1.73_m2} (ref >59)
GFR calc non Af Amer: 80 mL/min/{1.73_m2} (ref >59)
Globulin, Total: 3.3 g/dL (ref 1.5–4.5)
Glucose: 86 mg/dL (ref 65–99)
Potassium: 4 mmol/L (ref 3.5–5.2)
Sodium: 138 mmol/L (ref 134–144)
Total Protein: 7.3 g/dL (ref 6.0–8.5)

## 2019-07-19 LAB — CBC
Hematocrit: 41.5 % (ref 34.0–46.6)
Hemoglobin: 13.6 g/dL (ref 11.1–15.9)
MCH: 31.9 pg (ref 26.6–33.0)
MCHC: 32.8 g/dL (ref 31.5–35.7)
MCV: 97 fL (ref 79–97)
Platelets: 229 10*3/uL (ref 150–450)
RBC: 4.26 x10E6/uL (ref 3.77–5.28)
RDW: 12.5 % (ref 11.7–15.4)
WBC: 7 10*3/uL (ref 3.4–10.8)

## 2019-07-19 LAB — LIPID PANEL
Chol/HDL Ratio: 3.6 ratio (ref 0.0–4.4)
Cholesterol, Total: 166 mg/dL (ref 100–199)
HDL: 46 mg/dL (ref >39)
LDL Chol Calc (NIH): 110 mg/dL — ABNORMAL HIGH (ref 0–99)
Triglycerides: 51 mg/dL (ref 0–149)
VLDL Cholesterol Cal: 10 mg/dL (ref 5–40)

## 2019-07-19 NOTE — Telephone Encounter (Signed)
Patient is calling regarding prescription for vaginal estrogen cream. Patient stated that Dr. Quincy Simmonds requested that she get her mammogram before prescribing medication, which she did today.

## 2019-07-19 NOTE — Telephone Encounter (Signed)
Spoke with patient. MMG today at [facility], report not completed. Advised patient once report has been completed and reviewed by Dr. Quincy Simmonds, if negative, will send in RX for vaginal estrogen cream. Verified pharmacy on file. Patient verbalizes understanding and is agreeable.   AEX 07/18/19  Routing to Dr. Quincy Simmonds

## 2019-07-20 LAB — CYTOLOGY - PAP
Comment: NEGATIVE
Diagnosis: NEGATIVE
High risk HPV: NEGATIVE

## 2019-07-23 MED ORDER — PREMARIN 0.625 MG/GM VA CREA: TOPICAL_CREAM | VAGINAL | 3 refills | Status: DC

## 2019-07-23 NOTE — Telephone Encounter (Signed)
Ok for Premarin cream 1/2 gram pv at hs for 2 weeks and then 1/2 gram pv at hs 2 - 3 times per week.  Dispense:  30 gram, RF:  3.  She may want to download a coupon for the cream on the Premarin web site.

## 2019-07-23 NOTE — Telephone Encounter (Signed)
Spoke with patient, advised per Dr. Quincy Simmonds. Rx to verified pharmacy. Patient verbalizes understanding and is agreeable.   Encounter closed.

## 2019-09-10 ENCOUNTER — Ambulatory Visit: Payer: BLUE CROSS/BLUE SHIELD

## 2019-11-28 ENCOUNTER — Encounter: Payer: Self-pay | Admitting: Certified Nurse Midwife

## 2019-12-13 ENCOUNTER — Ambulatory Visit: Payer: BC Managed Care – PPO

## 2020-07-21 NOTE — Progress Notes (Signed)
49 y.o. G4P2002 Married [place] American female here for annual exam.    Patient complaining of vaginal dryness and discomfort with intercourse. Decreased sex drive.  Stopped using Premarin cream because it did not really help.  No hot flashes.  Feels sweaty at night. Not disruptive.   Gaining weight.   Did Covid vaccine.  No flu vaccine.   PCP: None     No LMP recorded. (Menstrual status: IUD).           Sexually active: Yes.    The current method of family planning is IUD--Mirena 07/28/16.    Exercising: Yes.    walks 3 miles 3x/week, light resistance training Smoker:  no  Health Maintenance: Pap:07-18-19 Neg:Neg HR HPV, 05-18-18 LGSIL:Neg HR HPV,04/08/17 LGSIL:Neg HR HPV History of abnormal Pap:  Yes, 06-09-17 colpo, ECC benign. 04/08/17 LGSIL:Neg HR HPV. LEEP 06/09/16 -LEEP specimen showing LGSIL. Colposcopy 04-16-16 unsatisfactory. 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 Margins were negative. Pap 03/08/16 showing LGSIL and negative HR HPV.Pap 08/09/14 ASCUS, negative HR HPV.  MMG: 07-19-19 3D/Neg/density C/Birads1--knows to schedule Colonoscopy: 2017 normal;next 10 years. Done for abdominal pain BMD:   n/a  Result  n/a TDaP:  04-08-17 Gardasil:   no HIV: Neg in preg Hep C:no Screening Labs:  PCP.    reports that she has never smoked. She has never used smokeless tobacco. She reports that she does not drink alcohol and does not use drugs.  Past Medical History:  Diagnosis Date  . ALLERGIC RHINITIS 10/18/2008  . ANEMIA-IRON DEFICIENCY 04/30/2008  . ANEMIA-NOS 04/30/2008  . COLONIC POLYPS, HX OF 04/30/2008    Past Surgical History:  Procedure Laterality Date  . CESAREAN SECTION  x2  . INTRAUTERINE DEVICE INSERTION     07-28-2016 inserted    Current Outpatient Medications  Medication Sig Dispense Refill  . conjugated estrogens (PREMARIN) vaginal cream Place 1/2 gram vaginally at night for 2 weeks and then 1/2 gram vaginally at night 2 -3 times per week. 30 g 3   . fexofenadine (ALLEGRA) 180 MG tablet Take 180 mg by mouth daily.    Marland Kitchen levonorgestrel (MIRENA) 20 MCG/24HR IUD 1 each by Intrauterine route once.     No current facility-administered medications for this visit.    Family History  Problem Relation Age of Onset  . Hypertension Mother   . Clotting disorder Father   . Alcoholism Father     Review of Systems  Constitutional: Positive for unexpected weight change (weight gain).  All other systems reviewed and are negative.   Exam:   BP 110/70   Pulse 87   Ht 5\' 2"  (1.575 m)   Wt 178 lb (80.7 kg)   SpO2 100%   BMI 32.56 kg/m     General appearance: alert, cooperative and appears stated age Head: normocephalic, without obvious abnormality, atraumatic Neck: no adenopathy, supple, symmetrical, trachea midline and thyroid normal to inspection and palpation Lungs: clear to auscultation bilaterally Breasts: normal appearance, no masses or tenderness, No nipple retraction or dimpling, No nipple discharge or bleeding, No axillary adenopathy Heart: regular rate and rhythm Abdomen: soft, non-tender; no masses, no organomegaly Extremities: extremities normal, atraumatic, no cyanosis or edema Skin: skin color, texture, turgor normal. No rashes or lesions Lymph nodes: cervical, supraclavicular, and axillary nodes normal. Neurologic: grossly normal  Pelvic: External genitalia:  no lesions              No abnormal inguinal nodes palpated.  Urethra:  normal appearing urethra with no masses, tenderness or lesions              Bartholins and Skenes: normal                 Vagina: normal appearing vagina with normal color and discharge, no lesions              Cervix: no lesions.  IUD strings noted.               Pap taken: Yes.   Bimanual Exam:  Uterus:  normal size, contour, position, consistency, mobility, non-tender              Adnexa: no mass, fullness, tenderness              Rectal exam: Declined.   Chaperone was present  for exam.  Assessment:   Well woman visit with normal exam.   Hx LGSIL.  Status post LEEP.  Mirena IUD. Vaginal atrophy.  Decreased libido.  Hx UTI.  Plan: Mammogram screening discussed. Self breast awareness reviewed. Pap and HR HPV as above. Guidelines for Calcium, Vitamin D, regular exercise program including cardiovascular and weight bearing exercise. Switch to Estrace cream 1/2 gm pv at hs x 2 weeks, then 1 gram twice weekly.   She will let me know if she would like to do testosterone therapy.  Follow up annually and prn.

## 2020-07-22 ENCOUNTER — Ambulatory Visit (INDEPENDENT_AMBULATORY_CARE_PROVIDER_SITE_OTHER): Payer: BC Managed Care – PPO | Admitting: Obstetrics and Gynecology

## 2020-07-22 ENCOUNTER — Other Ambulatory Visit: Payer: Self-pay

## 2020-07-22 ENCOUNTER — Encounter: Payer: Self-pay | Admitting: Obstetrics and Gynecology

## 2020-07-22 ENCOUNTER — Other Ambulatory Visit (HOSPITAL_COMMUNITY)
Admission: RE | Admit: 2020-07-22 | Discharge: 2020-07-22 | Disposition: A | Discharge status: Home / Self Care | Payer: BC Managed Care – PPO | Source: Ambulatory Visit | Attending: Obstetrics and Gynecology | Admitting: Obstetrics and Gynecology

## 2020-07-22 VITALS — BP 110/70 | HR 87 | Ht 62.0 in | Wt 178.0 lb

## 2020-07-22 DIAGNOSIS — Z01419 Encounter for gynecological examination (general) (routine) without abnormal findings: Secondary | ICD-10-CM | POA: Insufficient documentation

## 2020-07-22 MED ORDER — ESTRADIOL 0.1 MG/GM VA CREA: TOPICAL_CREAM | VAGINAL | 1 refills | Status: DC

## 2020-07-22 NOTE — Patient Instructions (Signed)

## 2020-07-25 LAB — CYTOLOGY - PAP
Adequacy: ABSENT
Comment: NEGATIVE
Diagnosis: UNDETERMINED — AB
High risk HPV: NEGATIVE

## 2020-08-18 ENCOUNTER — Telehealth: Payer: Self-pay

## 2020-08-18 NOTE — Telephone Encounter (Signed)
AEX 07/22/20 H/o UTIs   Spoke with pt. Pt states having urinary frequency, urgency with urine odor x 3 days. Denies vaginal itching, discharge, urinary burning, just discomfort. Denies fever, chills, back pain. Pt asking for Rx to be called in. Advised pt we do not treat over the phone and advised OV. Pt verbalized understanding and will schedule OV. OV scheduled with Dr Edward Jolly on 12/14 at 11am. Pt agreeable to date and time of appt.  Encounter closed

## 2020-08-18 NOTE — Telephone Encounter (Signed)
Patient is calling in regards to needing a prescription called in for UTI. Patient states has history of UTI's.

## 2020-08-18 NOTE — Progress Notes (Signed)
GYNECOLOGY  VISIT   HPI: 49 y.o.   Married  [place]  female   [phone] with No LMP recorded. (Menstrual status: IUD).   here for urinary frequency, urgency and odor.   Not drinking a lot of water.   After last intercourse, the odor began.  Hs last UTI was 07/04/19, E Coli.  Odor is her usual symptom.  No dysuria.   No vaginal discharge.   Started Estrace after her appointment 07/22/20.  Last used 6 days ago.  It is helping but she still has some discomfort at the introitus.   Urine Dip:neg  GYNECOLOGIC HISTORY: No LMP recorded. (Menstrual status: IUD). Contraception: Mirena IUD 07-28-16 Menopausal hormone therapy:  none Last mammogram: 07-19-19 3D/Neg/density C/Birads1--knows to schedule Last pap smear:  ASCUS, negative HR HPV.        OB History    Gravida  2   Para  2   Term  2   Preterm      AB      Living  2     SAB      IAB      Ectopic      Multiple      Live Births                 Patient Active Problem List   Diagnosis Date Noted  . SI (sacroiliac) joint dysfunction 11/27/2013  . Nonallopathic lesion of sacral region 11/27/2013  . Cluster headache 02/10/2012  . Preventative health care 08/09/2011  . ALLERGIC RHINITIS 10/18/2008  . FATIGUE 10/18/2008  . ANEMIA-IRON DEFICIENCY 04/30/2008  . COLONIC POLYPS, HX OF 04/30/2008    Past Medical History:  Diagnosis Date  . ALLERGIC RHINITIS 10/18/2008  . ANEMIA-IRON DEFICIENCY 04/30/2008  . ANEMIA-NOS 04/30/2008  . COLONIC POLYPS, HX OF 04/30/2008    Past Surgical History:  Procedure Laterality Date  . CESAREAN SECTION  x2  . INTRAUTERINE DEVICE INSERTION     07-28-2016 inserted    Current Outpatient Medications  Medication Sig Dispense Refill  . estradiol (ESTRACE) 0.1 MG/GM vaginal cream Use 1/2 g vaginally every night for the first 2 weeks, then use 1 gram vaginally two times per week. 42.5 g 1  . fexofenadine (ALLEGRA) 180 MG tablet Take 180 mg by mouth daily.    Marland Kitchen  levonorgestrel (MIRENA) 20 MCG/24HR IUD 1 each by Intrauterine route once.     No current facility-administered medications for this visit.     ALLERGIES: Oxycodone  Family History  Problem Relation Age of Onset  . Hypertension Mother   . Clotting disorder Father   . Alcoholism Father     Social History   Socioeconomic History  . Marital status: Married    Spouse name: Not on file  . Number of children: Not on file  . Years of education: Not on file  . Highest education level: Not on file  Occupational History  . Not on file  Tobacco Use  . Smoking status: Never Smoker  . Smokeless tobacco: Never Used  Vaping Use  . Vaping Use: Never used  Substance and Sexual Activity  . Alcohol use: No    Alcohol/week: 0.0 standard drinks  . Drug use: No  . Sexual activity: Yes    Partners: Male    Birth control/protection: I.U.D.    Comment: Mirena IUD 07-28-16  Other Topics Concern  . Not on file  Social History Narrative  . Not on file   Social Determinants of Health  Financial Resource Strain: Not on file  Food Insecurity: Not on file  Transportation Needs: Not on file  Physical Activity: Not on file  Stress: Not on file  Social Connections: Not on file  Intimate Partner Violence: Not on file    Review of Systems  Genitourinary: Positive for frequency and urgency.       Urine odor  All other systems reviewed and are negative.   PHYSICAL EXAMINATION:    BP 110/70   Pulse (!) 57   Ht 5' 1.75" (1.568 m)   Wt 177 lb (80.3 kg)   SpO2 100%   BMI 32.64 kg/m     General appearance: alert, cooperative and appears stated age   Pelvic: External genitalia:  no lesions              Urethra:  normal appearing urethra with no masses, tenderness or lesions              Bartholins and Skenes: normal                 Vagina: normal appearing vagina with normal color and discharge, no lesions              Cervix: no lesions.  IUD strings noted.                 Bimanual  Exam:  Uterus:  normal size, contour, position, consistency, mobility, non-tender              Adnexa: no mass, fullness, tenderness    Chaperone was present for exam.  ASSESSMENT  Urinary odor and frequency. Vaginal odor.  Hx UTI. Mirena IUD.  Vaginal atrophy.  Using vaginal estrogen cream.  PLAN  UC.  Affirm testing.  Increase hydration.  Use vaginal estrogen at the introitus and urethra with each application.  We talked about D Mannose for UTI prevention as well.  Use of cooking oils for lubrication also reviewed.  24 min  total time was spent for this patient encounter, including preparation, face-to-face counseling with the patient, coordination of care, and documentation of the encounter.

## 2020-08-19 ENCOUNTER — Other Ambulatory Visit: Payer: Self-pay | Admitting: Obstetrics and Gynecology

## 2020-08-19 ENCOUNTER — Ambulatory Visit: Payer: BC Managed Care – PPO | Admitting: Obstetrics and Gynecology

## 2020-08-19 ENCOUNTER — Other Ambulatory Visit: Payer: Self-pay

## 2020-08-19 ENCOUNTER — Encounter: Payer: Self-pay | Admitting: Obstetrics and Gynecology

## 2020-08-19 VITALS — BP 110/70 | HR 57 | Ht 61.75 in | Wt 177.0 lb

## 2020-08-19 DIAGNOSIS — N898 Other specified noninflammatory disorders of vagina: Secondary | ICD-10-CM | POA: Diagnosis not present

## 2020-08-19 DIAGNOSIS — R350 Frequency of micturition: Secondary | ICD-10-CM | POA: Diagnosis not present

## 2020-08-19 LAB — POCT URINALYSIS DIPSTICK
Bilirubin, UA: NEGATIVE
Blood, UA: NEGATIVE
Glucose, UA: NEGATIVE
Ketones, UA: NEGATIVE
Leukocytes, UA: NEGATIVE
Nitrite, UA: NEGATIVE
Protein, UA: NEGATIVE
Urobilinogen, UA: 0.2 E.U./dL
pH, UA: 5 (ref 5.0–8.0)

## 2020-08-22 ENCOUNTER — Other Ambulatory Visit: Payer: Self-pay | Admitting: Obstetrics and Gynecology

## 2020-08-22 ENCOUNTER — Telehealth: Payer: Self-pay

## 2020-08-22 DIAGNOSIS — Z1231 Encounter for screening mammogram for malignant neoplasm of breast: Secondary | ICD-10-CM

## 2020-08-22 LAB — VAGINITIS/VAGINOSIS, DNA PROBE
Candida Species: NEGATIVE
Gardnerella vaginalis: NEGATIVE
Trichomonas vaginosis: NEGATIVE

## 2020-08-22 MED ORDER — SULFAMETHOXAZOLE-TRIMETHOPRIM 800-160 MG PO TABS: 1.0000 | ORAL_TABLET | Freq: Two times a day (BID) | ORAL | 0 refills | Status: AC

## 2020-08-22 NOTE — Telephone Encounter (Signed)
Spoke with pt. Pt given results and recommendations per Dr Edward Jolly. Pt states would like to start Bactrim Rx.  RX Bactrim DS bid x 3 days # 6, 0RF sent to pharmacy on file. Pt aware will still be called for final UC results. Pt verbalized understanding.  Routing to Dr Edward Jolly  Encounter closed

## 2020-08-22 NOTE — Telephone Encounter (Signed)
-----   Message from Patton Salles, MD sent at 08/22/2020 11:14 AM EST ----- Please contact patient with preliminary results showing E Coli UTI.  The antibiotic sensitivities are not back yet.  Does she want to start Bactrim DS po bid x 3 days before final results are ready because it is a weekend?

## 2020-08-27 LAB — URINE CULTURE

## 2020-10-06 ENCOUNTER — Ambulatory Visit
Admission: RE | Admit: 2020-10-06 | Discharge: 2020-10-06 | Disposition: A | Discharge status: Home / Self Care | Payer: BC Managed Care – PPO | Source: Ambulatory Visit | Attending: Obstetrics and Gynecology | Admitting: Obstetrics and Gynecology

## 2020-10-06 ENCOUNTER — Other Ambulatory Visit: Payer: Self-pay

## 2020-10-06 DIAGNOSIS — Z1231 Encounter for screening mammogram for malignant neoplasm of breast: Secondary | ICD-10-CM

## 2020-10-07 ENCOUNTER — Other Ambulatory Visit: Payer: Self-pay | Admitting: Obstetrics and Gynecology

## 2020-10-07 DIAGNOSIS — R928 Other abnormal and inconclusive findings on diagnostic imaging of breast: Secondary | ICD-10-CM

## 2020-10-27 ENCOUNTER — Other Ambulatory Visit: Payer: Self-pay

## 2020-10-27 ENCOUNTER — Ambulatory Visit: Payer: BC Managed Care – PPO

## 2020-10-27 ENCOUNTER — Ambulatory Visit
Admission: RE | Admit: 2020-10-27 | Discharge: 2020-10-27 | Disposition: A | Discharge status: Home / Self Care | Payer: BC Managed Care – PPO | Source: Ambulatory Visit | Attending: Obstetrics and Gynecology | Admitting: Obstetrics and Gynecology

## 2020-10-27 DIAGNOSIS — R928 Other abnormal and inconclusive findings on diagnostic imaging of breast: Secondary | ICD-10-CM

## 2021-02-07 ENCOUNTER — Telehealth: Payer: Self-pay | Admitting: Obstetrics and Gynecology

## 2021-02-07 MED ORDER — SULFAMETHOXAZOLE-TRIMETHOPRIM 800-160 MG PO TABS
1.0000 | ORAL_TABLET | Freq: Two times a day (BID) | ORAL | 0 refills | Status: DC
Start: 2021-02-07 — End: 2021-08-24

## 2021-02-07 NOTE — Telephone Encounter (Signed)
Phone call from patient after hours.   Patient calling with concern for developing UTI. Symptoms started yesterday of urinary frequency and bladder pressure.  No blood in urine. No fever, back pain, shakes or chills.   Took Bactrim DS for E Coli UTI in December, and this worked well.   She is using Estradiol cream to help reduce risk of UTIs.   Bactrim DS po bid x 3 days.  Hydrate well.  Call office if not improved in 48 hours.

## 2021-03-07 ENCOUNTER — Other Ambulatory Visit: Payer: Self-pay | Admitting: Obstetrics and Gynecology

## 2021-04-13 ENCOUNTER — Other Ambulatory Visit: Payer: Self-pay | Admitting: Obstetrics and Gynecology

## 2021-08-24 ENCOUNTER — Other Ambulatory Visit: Payer: Self-pay

## 2021-08-24 ENCOUNTER — Other Ambulatory Visit (HOSPITAL_COMMUNITY)
Admission: RE | Admit: 2021-08-24 | Discharge: 2021-08-24 | Disposition: A | Discharge status: Home / Self Care | Payer: Commercial Managed Care - PPO | Source: Ambulatory Visit | Attending: Obstetrics and Gynecology | Admitting: Obstetrics and Gynecology

## 2021-08-24 ENCOUNTER — Ambulatory Visit (INDEPENDENT_AMBULATORY_CARE_PROVIDER_SITE_OTHER): Payer: Commercial Managed Care - PPO | Admitting: Obstetrics and Gynecology

## 2021-08-24 ENCOUNTER — Encounter: Payer: Self-pay | Admitting: Obstetrics and Gynecology

## 2021-08-24 VITALS — BP 118/72 | HR 80 | Ht 62.0 in | Wt 157.0 lb

## 2021-08-24 DIAGNOSIS — Z01419 Encounter for gynecological examination (general) (routine) without abnormal findings: Secondary | ICD-10-CM

## 2021-08-24 DIAGNOSIS — N951 Menopausal and female climacteric states: Secondary | ICD-10-CM | POA: Diagnosis not present

## 2021-08-24 DIAGNOSIS — Z124 Encounter for screening for malignant neoplasm of cervix: Secondary | ICD-10-CM | POA: Diagnosis present

## 2021-08-24 DIAGNOSIS — Z23 Encounter for immunization: Secondary | ICD-10-CM

## 2021-08-24 MED ORDER — PAROXETINE HCL 10 MG PO TABS: 10.0000 mg | ORAL_TABLET | ORAL | 1 refills | Status: DC

## 2021-08-24 MED ORDER — ESTRADIOL 0.1 MG/GM VA CREA: TOPICAL_CREAM | VAGINAL | 1 refills | Status: DC

## 2021-08-24 NOTE — Progress Notes (Signed)
50 y.o. G40P2002 Married [place] American female here for annual exam.    No bleeding or spotting.   Starting to have hot flashes at night.  They are manageable.   Notes her concentration is not as good. New job.   Likes the vaginal estrogen.  Helps to reduce UTIs.   PCP:  Oliver Barre, MD   No LMP recorded. (Menstrual status: IUD).     Period Cycle (Days):  (no cycle with Mirena IUD)     Sexually active: Yes.    The current method of family planning is IUD--Mirena 07/28/16.    Exercising: No.   Walks dog Smoker:  no  Health Maintenance: Pap:  07-22-20 ASCUS:Neg HR HPV, 07-18-19 Neg:Neg HR HPV, 05-18-18 LGSIL:Neg HR HPV History of abnormal Pap:  Yes,  06-09-17 colpo, ECC benign. 04/08/17 LGSIL:Neg HR HPV.  LEEP 06/09/16 - LEEP specimen showing LGSIL. Colposcopy 04-16-16 unsatisfactory.  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  Margins were negative. Pap 03/08/16 showing LGSIL and negative HR HPV.Pap 08/09/14 ASCUS, negative HR HPV.  MMG:  10-06-20 Distortion Rt.Br.;Lt.Br.Neg. Diag.Rt.Br.Neg/BiRads1/screening 25yr Colonoscopy:  2017 normal;next 10 years BMD:   n/a  Result  n/a TDaP:  04-08-17 Gardasil:   no HIV: Neg in preg Hep C:no Screening Labs:   Flu vaccine:  today.  Covid booster:  one.   reports that she has never smoked. She has never used smokeless tobacco. She reports that she does not drink alcohol and does not use drugs.  Past Medical History:  Diagnosis Date   ALLERGIC RHINITIS 10/18/2008   ANEMIA-IRON DEFICIENCY 04/30/2008   ANEMIA-NOS 04/30/2008   COLONIC POLYPS, HX OF 04/30/2008    Past Surgical History:  Procedure Laterality Date   CESAREAN SECTION  x2   INTRAUTERINE DEVICE INSERTION     07-28-2016 inserted    Current Outpatient Medications  Medication Sig Dispense Refill   estradiol (ESTRACE) 0.1 MG/GM vaginal cream USE 1 GRAM VAGINALLY 2 TIMES EVERY WEEK 42.5 g 0   fexofenadine (ALLEGRA) 180 MG tablet Take 180 mg by mouth daily.     levonorgestrel  (MIRENA) 20 MCG/24HR IUD 1 each by Intrauterine route once.     No current facility-administered medications for this visit.    Family History  Problem Relation Age of Onset   Hypertension Mother    Clotting disorder Father    Alcoholism Father     Review of Systems  All other systems reviewed and are negative.  Exam:   BP 118/72    Pulse 80    Ht 5\' 2"  (1.575 m)    Wt 157 lb (71.2 kg)    SpO2 100%    BMI 28.72 kg/m     General appearance: alert, cooperative and appears stated age Head: normocephalic, without obvious abnormality, atraumatic Neck: no adenopathy, supple, symmetrical, trachea midline and thyroid normal to inspection and palpation Lungs: clear to auscultation bilaterally Breasts: normal appearance, no masses or tenderness, No nipple retraction or dimpling, No nipple discharge or bleeding, No axillary adenopathy Heart: regular rate and rhythm Abdomen: soft, non-tender; no masses, no organomegaly Extremities: extremities normal, atraumatic, no cyanosis or edema Skin: skin color, texture, turgor normal. No rashes or lesions Lymph nodes: cervical, supraclavicular, and axillary nodes normal. Neurologic: grossly normal  Pelvic: External genitalia:  no lesions              No abnormal inguinal nodes palpated.              Urethra:  normal appearing urethra  with no masses, tenderness or lesions              Bartholins and Skenes: normal                 Vagina: normal appearing vagina with normal color and discharge, no lesions              Cervix: no lesions.  IUD strings noted.               Pap taken: yes Bimanual Exam:  Uterus:  normal size, contour, position, consistency, mobility, non-tender              Adnexa: no mass, fullness, tenderness              Rectal exam: yes.  Confirms.              Anus:  normal sphincter tone, no lesions  Chaperone was present for exam:  Marchelle Folks, CMA  Assessment:   Well woman visit with gynecologic exam. Hx LGSIL.  Status post  LEEP.  Mirena IUD. Vaginal atrophy.  Decreased libido.  Hx UTI. Menopausal symptoms.   Plan: Mammogram screening discussed. Self breast awareness reviewed. Pap and HR HPV as above. Guidelines for Calcium, Vitamin D, regular exercise program including cardiovascular and weight bearing exercise. Routine labs, FSH, and estradiol.  Flu vaccine.  Refill of vaginal estrogen cream.  I discussed potential effect on breast cancer.  We discussed options for treatment of menopausal symptoms:  HRT, SSRI, SNRI, Neurontin, herbal options.   Rx for Paxil 10 mg daily.  Side effects discussed.  Fu in 6 weeks.  Follow up annually and prn.   After visit summary provided.

## 2021-08-24 NOTE — Patient Instructions (Signed)
EXERCISE AND DIET:  We recommended that you start or continue a regular exercise program for good health. Regular exercise means any activity that makes your heart beat faster and makes you sweat.  We recommend exercising at least 30 minutes per day at least 3 days a week, preferably 4 or 5.  We also recommend a diet low in fat and sugar.  Inactivity, poor dietary choices and obesity can cause diabetes, heart attack, stroke, and kidney damage, among others.   ° °ALCOHOL AND SMOKING:  Women should limit their alcohol intake to no more than 7 drinks/beers/glasses of wine (combined, not each!) per week. Moderation of alcohol intake to this level decreases your risk of breast cancer and liver damage. And of course, no recreational drugs are part of a healthy lifestyle.  And absolutely no smoking or even second hand smoke. Most people know smoking can cause heart and lung diseases, but did you know it also contributes to weakening of your bones? Aging of your skin?  Yellowing of your teeth and nails? ° °CALCIUM AND VITAMIN D:  Adequate intake of calcium and Vitamin D are recommended.  The recommendations for exact amounts of these supplements seem to change often, but generally speaking 600 mg of calcium (either carbonate or citrate) and 800 units of Vitamin D per day seems prudent. Certain women may benefit from higher intake of Vitamin D.  If you are among these women, your doctor will have told you during your visit.   ° °PAP SMEARS:  Pap smears, to check for cervical cancer or precancers,  have traditionally been done yearly, although recent scientific advances have shown that most women can have pap smears less often.  However, every woman still should have a physical exam from her gynecologist every year. It will include a breast check, inspection of the vulva and vagina to check for abnormal growths or skin changes, a visual exam of the cervix, and then an exam to evaluate the size and shape of the uterus and  ovaries.  And after 50 years of age, a rectal exam is indicated to check for rectal cancers. We will also provide age appropriate advice regarding health maintenance, like when you should have certain vaccines, screening for sexually transmitted diseases, bone density testing, colonoscopy, mammograms, etc.  ° °MAMMOGRAMS:  All women over 40 years old should have a yearly mammogram. Many facilities now offer a "3D" mammogram, which may cost around $50 extra out of pocket. If possible,  we recommend you accept the option to have the 3D mammogram performed.  It both reduces the number of women who will be called back for extra views which then turn out to be normal, and it is better than the routine mammogram at detecting truly abnormal areas.   ° °COLONOSCOPY:  Colonoscopy to screen for colon cancer is recommended for all women at age 50.  We know, you hate the idea of the prep.  We agree, BUT, having colon cancer and not knowing it is worse!!  Colon cancer so often starts as a polyp that can be seen and removed at colonscopy, which can quite literally save your life!  And if your first colonoscopy is normal and you have no family history of colon cancer, most women don't have to have it again for 10 years.  Once every ten years, you can do something that may end up saving your life, right?  We will be happy to help you get it scheduled when you are ready.    Be sure to check your insurance coverage so you understand how much it will cost.  It may be covered as a preventative service at no cost, but you should check your particular policy.    Paroxetine Tablets What is this medication? PAROXETINE (pa ROX e teen) treats depression, anxiety, obsessive-compulsive disorder (OCD), post-traumatic stress disorder (PTSD), and premenstrual dysphoric disorder (PMDD). It increases the amount of serotonin in the brain, a hormone that helps regulate mood. It belongs to a group of medications called SSRIs. This medicine may be  used for other purposes; ask your health care provider or pharmacist if you have questions. COMMON BRAND NAME(S): Paxil, Pexeva What should I tell my care team before I take this medication? They need to know if you have any of these conditions: Bipolar disorder or a family history of bipolar disorder Bleeding disorders Glaucoma Heart disease Kidney disease Liver disease Low levels of sodium in the blood Seizures Suicidal thoughts, plans, or attempt; a previous suicide attempt by you or a family member Take MAOIs like Carbex, Eldepryl, Marplan, Nardil, and Parnate Take medications that treat or prevent blood clots Thyroid disease An unusual or allergic reaction to paroxetine, other medications, foods, dyes, or preservatives Pregnant or trying to get pregnant Breast-feeding How should I use this medication? Take this medication by mouth with a glass of water. Follow the directions on the prescription label. You can take it with or without food. Take your medication at regular intervals. Do not take your medication more often than directed. Do not stop taking this medication suddenly except upon the advice of your care team. Stopping this medication too quickly may cause serious side effects or your condition may worsen. A special MedGuide will be given to you by the pharmacist with each prescription and refill. Be sure to read this information carefully each time. Talk to your care team regarding the use of this medication in children. Special care may be needed. Overdosage: If you think you have taken too much of this medicine contact a poison control center or emergency room at once. NOTE: This medicine is only for you. Do not share this medicine with others. What if I miss a dose? If you miss a dose, take it as soon as you can. If it is almost time for your next dose, take only that dose. Do not take double or extra doses. What may interact with this medication? Do not take this  medication with any of the following: Linezolid MAOIs like Carbex, Eldepryl, Marplan, Nardil, and Parnate Methylene blue (injected into a vein) Pimozide Thioridazine This medication may also interact with the following: Alcohol Amphetamines Aspirin and aspirin-like medications Atomoxetine Certain medications for depression, anxiety, or psychotic disturbances Certain medications for irregular heart beat like propafenone, flecainide, encainide, and quinidine Certain medications for migraine headache like almotriptan, eletriptan, frovatriptan, naratriptan, rizatriptan, sumatriptan, zolmitriptan Cimetidine Digoxin Diuretics Fentanyl Fosamprenavir Furazolidone Isoniazid Lithium Medications that treat or prevent blood clots like warfarin, enoxaparin, and dalteparin Medications for sleep NSAIDs, medications for pain and inflammation, like ibuprofen or naproxen Phenobarbital Phenytoin Procarbazine Rasagiline Ritonavir Supplements like St. John's wort, kava kava, valerian Tamoxifen Tramadol Tryptophan This list may not describe all possible interactions. Give your health care provider a list of all the medicines, herbs, non-prescription drugs, or dietary supplements you use. Also tell them if you smoke, drink alcohol, or use illegal drugs. Some items may interact with your medicine. What should I watch for while using this medication? Tell your care team if your  symptoms do not get better or if they get worse. Visit your care team for regular checks on your progress. Because it may take several weeks to see the full effects of this medication, it is important to continue your treatment as prescribed by your care team. Watch for new or worsening thoughts of suicide or depression. This includes sudden changes in mood, behaviors, or thoughts. These changes can happen at any time but are more common in the beginning of treatment or after a change in dose. Call your care team right away if you  experience these thoughts or worsening depression. Manic episodes may happen in patients with bipolar disorder who take this medication. Watch for changes in feelings or behaviors such as feeling anxious, nervous, agitated, panicky, irritable, hostile, aggressive, impulsive, severely restless, overly excited and hyperactive, or trouble sleeping. These changes can happen at any time but are more common in the beginning of treatment or after a change in dose. Call your care team right away if you notice any of these symptoms. You may get drowsy or dizzy. Do not drive, use machinery, or do anything that needs mental alertness until you know how this medication affects you. Do not stand or sit up quickly, especially if you are an older patient. This reduces the risk of dizzy or fainting spells. Alcohol may interfere with the effect of this medication. Avoid alcoholic drinks. Your mouth may get dry. Chewing sugarless gum or sucking hard candy, and drinking plenty of water will help. Contact your care team if the problem does not go away or is severe. What side effects may I notice from receiving this medication? Side effects that you should report to your care team as soon as possible: Allergic reactions--skin rash, itching, hives, swelling of the face, lips, tongue, or throat Bleeding--bloody or black, tar-like stools, red or dark brown urine, vomiting blood or brown material that looks like coffee grounds, small, red or purple spots on skin, unusual bleeding or bruising Heart rhythm changes--fast or irregular heartbeat, dizziness, feeling faint or lightheaded, chest pain, trouble breathing Low sodium level--muscle weakness, fatigue, dizziness, headache, confusion Serotonin syndrome--irritability, confusion, fast or irregular heartbeat, muscle stiffness, twitching muscles, sweating, high fever, seizures, chills, vomiting, diarrhea Sudden eye pain or change in vision such as blurry vision, seeing halos around  lights, vision loss Thoughts of suicide or self-harm, worsening mood, feelings of depression Side effects that usually do not require medical attention (report to your care team if they continue or are bothersome): Change in sex drive or performance Diarrhea Excessive sweating Nausea Tremors or shaking Upset stomach This list may not describe all possible side effects. Call your doctor for medical advice about side effects. You may report side effects to FDA at 1-800-FDA-1088. Where should I keep my medication? Keep out of the reach of children and pets. Store at room temperature between 15 and 30 degrees C (59 and 86 degrees F). Keep container tightly closed. Throw away any unused medication after the expiration date. NOTE: This sheet is a summary. It may not cover all possible information. If you have questions about this medicine, talk to your doctor, pharmacist, or health care provider.  2022 [employer]vier/Gold Standard (2020-08-13 00:00:00)

## 2021-08-25 LAB — ESTRADIOL: Estradiol: 25 pg/mL

## 2021-08-25 LAB — FOLLICLE STIMULATING HORMONE: FSH: 84.5 m[IU]/mL

## 2021-08-25 LAB — COMPREHENSIVE METABOLIC PANEL
AG Ratio: 1 (calc) (ref 1.0–2.5)
ALT: 21 U/L (ref 6–29)
AST: 23 U/L (ref 10–35)
Albumin: 4 g/dL (ref 3.6–5.1)
Alkaline phosphatase (APISO): 79 U/L (ref 37–153)
BUN: 8 mg/dL (ref 7–25)
CO2: 25 mmol/L (ref 20–32)
Calcium: 9.5 mg/dL (ref 8.6–10.4)
Chloride: 104 mmol/L (ref 98–110)
Creat: 0.9 mg/dL (ref 0.50–1.03)
Globulin: 4 g/dL (calc) — ABNORMAL HIGH (ref 1.9–3.7)
Glucose, Bld: 71 mg/dL (ref 65–99)
Potassium: 4.2 mmol/L (ref 3.5–5.3)
Sodium: 139 mmol/L (ref 135–146)
Total Bilirubin: 0.5 mg/dL (ref 0.2–1.2)
Total Protein: 8 g/dL (ref 6.1–8.1)

## 2021-08-25 LAB — LIPID PANEL
Cholesterol: 205 mg/dL — ABNORMAL HIGH (ref <200)
HDL: 54 mg/dL (ref >=50)
LDL Cholesterol (Calc): 136 mg/dL (calc) — ABNORMAL HIGH
Non-HDL Cholesterol (Calc): 151 mg/dL (calc) — ABNORMAL HIGH (ref <130)
Total CHOL/HDL Ratio: 3.8 (calc) (ref <5.0)
Triglycerides: 60 mg/dL (ref <150)

## 2021-08-25 LAB — CBC
HCT: 42.9 % (ref 35.0–45.0)
Hemoglobin: 14.4 g/dL (ref 11.7–15.5)
MCH: 33 pg (ref 27.0–33.0)
MCHC: 33.6 g/dL (ref 32.0–36.0)
MCV: 98.2 fL (ref 80.0–100.0)
MPV: 12 fL (ref 7.5–12.5)
Platelets: 224 10*3/uL (ref 140–400)
RBC: 4.37 10*6/uL (ref 3.80–5.10)
RDW: 12.1 % (ref 11.0–15.0)
WBC: 5.8 10*3/uL (ref 3.8–10.8)

## 2021-08-28 LAB — CYTOLOGY - PAP
Comment: NEGATIVE
Diagnosis: UNDETERMINED — AB
High risk HPV: NEGATIVE

## 2021-10-05 ENCOUNTER — Ambulatory Visit: Payer: Commercial Managed Care - PPO | Admitting: Obstetrics and Gynecology

## 2021-10-05 DIAGNOSIS — Z0289 Encounter for other administrative examinations: Secondary | ICD-10-CM

## 2021-10-05 NOTE — Progress Notes (Deleted)
GYNECOLOGY  VISIT   HPI: 51 y.o.   Married  [place] American  female   [phone] with No LMP recorded. (Menstrual status: IUD).   here for medication follow up.    GYNECOLOGIC HISTORY: No LMP recorded. (Menstrual status: IUD). Contraception:  Mirena IUD 07/28/16 Menopausal hormone therapy: ***Estrace Cream Last mammogram:  *** 10-06-20 Distortion Rt.Br.;Lt.Br.Neg. Diag.Rt.Br.Neg/BiRads1/screening 59yr Last pap smear:  08-24-21 ASCUS:Neg HR HPV, 07-22-20 ASCUS:Neg HR HPV, 07-18-19 Neg:Neg HR HPV        OB History     Gravida  2   Para  2   Term  2   Preterm      AB      Living  2      SAB      IAB      Ectopic      Multiple      Live Births                 Patient Active Problem List   Diagnosis Date Noted   SI (sacroiliac) joint dysfunction 11/27/2013   Nonallopathic lesion of sacral region 11/27/2013   Cluster headache 02/10/2012   Preventative health care 08/09/2011   ALLERGIC RHINITIS 10/18/2008   FATIGUE 10/18/2008   ANEMIA-IRON DEFICIENCY 04/30/2008   COLONIC POLYPS, HX OF 04/30/2008    Past Medical History:  Diagnosis Date   ALLERGIC RHINITIS 10/18/2008   ANEMIA-IRON DEFICIENCY 04/30/2008   ANEMIA-NOS 04/30/2008   COLONIC POLYPS, HX OF 04/30/2008    Past Surgical History:  Procedure Laterality Date   CESAREAN SECTION  x2   INTRAUTERINE DEVICE INSERTION     07-28-2016 inserted    Current Outpatient Medications  Medication Sig Dispense Refill   estradiol (ESTRACE) 0.1 MG/GM vaginal cream USE 1 GRAM VAGINALLY 2 TIMES EVERY WEEK 42.5 g 1   fexofenadine (ALLEGRA) 180 MG tablet Take 180 mg by mouth daily.     levonorgestrel (MIRENA) 20 MCG/24HR IUD 1 each by Intrauterine route once.     PARoxetine (PAXIL) 10 MG tablet Take 1 tablet (10 mg total) by mouth every morning. 30 tablet 1   No current facility-administered medications for this visit.     ALLERGIES: Oxycodone  Family History  Problem Relation Age of Onset   Hypertension Mother     Clotting disorder Father    Alcoholism Father     Social History   Socioeconomic History   Marital status: Married    Spouse name: Not on file   Number of children: Not on file   Years of education: Not on file   Highest education level: Not on file  Occupational History   Not on file  Tobacco Use   Smoking status: Never   Smokeless tobacco: Never  Vaping Use   Vaping Use: Never used  Substance and Sexual Activity   Alcohol use: No    Alcohol/week: 0.0 standard drinks   Drug use: No   Sexual activity: Yes    Partners: Male    Birth control/protection: I.U.D.    Comment: Mirena IUD 07-28-16  Other Topics Concern   Not on file  Social History Narrative   Not on file   Social Determinants of Health   Financial Resource Strain: Not on file  Food Insecurity: Not on file  Transportation Needs: Not on file  Physical Activity: Not on file  Stress: Not on file  Social Connections: Not on file  Intimate Partner Violence: Not on file    Review of Systems  PHYSICAL  EXAMINATION:    There were no vitals taken for this visit.    General appearance: alert, cooperative and appears stated age Head: Normocephalic, without obvious abnormality, atraumatic Neck: no adenopathy, supple, symmetrical, trachea midline and thyroid normal to inspection and palpation Lungs: clear to auscultation bilaterally Breasts: normal appearance, no masses or tenderness, No nipple retraction or dimpling, No nipple discharge or bleeding, No axillary or supraclavicular adenopathy Heart: regular rate and rhythm Abdomen: soft, non-tender, no masses,  no organomegaly Extremities: extremities normal, atraumatic, no cyanosis or edema Skin: Skin color, texture, turgor normal. No rashes or lesions Lymph nodes: Cervical, supraclavicular, and axillary nodes normal. No abnormal inguinal nodes palpated Neurologic: Grossly normal  Pelvic: External genitalia:  no lesions              Urethra:  normal appearing  urethra with no masses, tenderness or lesions              Bartholins and Skenes: normal                 Vagina: normal appearing vagina with normal color and discharge, no lesions              Cervix: no lesions                Bimanual Exam:  Uterus:  normal size, contour, position, consistency, mobility, non-tender              Adnexa: no mass, fullness, tenderness              Rectal exam: {yes no:[id number]}.  Confirms.              Anus:  normal sphincter tone, no lesions  Chaperone was present for exam:  ***  ASSESSMENT     PLAN     An After Visit Summary was printed and given to the patient.  ______ minutes face to face time of which over 50% was spent in counseling.

## 2021-10-05 NOTE — Progress Notes (Signed)
GYNECOLOGY  VISIT   HPI: 51 y.o.   Married  [place] American  female   [phone] with No LMP recorded. (Menstrual status: IUD).   here for follow up. Patient never started on Paxil. She still has questions about this medication.  Seen on 08/24/21 for annual exam and reported hot flashes and night sweats and decreased concentration at work with her new job.  She was prescribed Paxil.   FSH 84.5 and estradiol 25 on 08/24/21.  GYNECOLOGIC HISTORY: No LMP recorded. (Menstrual status: IUD). Contraception:  Mirena IUD 07-28-16 Menopausal hormone therapy:  Estrace cream Last mammogram:  10-06-20 Distortion Rt.Br.;Lt.Br.Neg. Diag.Rt.Br.Neg/BiRads1/screening 41yr--knows to schedule Last pap smear:   08-24-21 ASCUS:Neg HR HPV, 07-22-20 ASCUS:Neg HR HPV, 07-18-19 Neg:Neg HR HPV               OB History     Gravida  2   Para  2   Term  2   Preterm      AB      Living  2      SAB      IAB      Ectopic      Multiple      Live Births                 Patient Active Problem List   Diagnosis Date Noted   SI (sacroiliac) joint dysfunction 11/27/2013   Nonallopathic lesion of sacral region 11/27/2013   Cluster headache 02/10/2012   Preventative health care 08/09/2011   ALLERGIC RHINITIS 10/18/2008   FATIGUE 10/18/2008   ANEMIA-IRON DEFICIENCY 04/30/2008   COLONIC POLYPS, HX OF 04/30/2008    Past Medical History:  Diagnosis Date   ALLERGIC RHINITIS 10/18/2008   ANEMIA-IRON DEFICIENCY 04/30/2008   ANEMIA-NOS 04/30/2008   COLONIC POLYPS, HX OF 04/30/2008    Past Surgical History:  Procedure Laterality Date   CESAREAN SECTION  x2   INTRAUTERINE DEVICE INSERTION     07-28-2016 inserted    Current Outpatient Medications  Medication Sig Dispense Refill   estradiol (ESTRACE) 0.1 MG/GM vaginal cream USE 1 GRAM VAGINALLY 2 TIMES EVERY WEEK 42.5 g 1   fexofenadine (ALLEGRA) 180 MG tablet Take 180 mg by mouth daily.     levonorgestrel (MIRENA) 20 MCG/24HR IUD 1 each by  Intrauterine route once.     PARoxetine (PAXIL) 10 MG tablet Take 1 tablet (10 mg total) by mouth every morning. (Patient not taking: Reported on 10/06/2021) 30 tablet 1   No current facility-administered medications for this visit.     ALLERGIES: Oxycodone  Family History  Problem Relation Age of Onset   Hypertension Mother    Clotting disorder Father    Alcoholism Father     Social History   Socioeconomic History   Marital status: Married    Spouse name: Not on file   Number of children: Not on file   Years of education: Not on file   Highest education level: Not on file  Occupational History   Not on file  Tobacco Use   Smoking status: Never   Smokeless tobacco: Never  Vaping Use   Vaping Use: Never used  Substance and Sexual Activity   Alcohol use: No    Alcohol/week: 0.0 standard drinks   Drug use: No   Sexual activity: Yes    Partners: Male    Birth control/protection: I.U.D.    Comment: Mirena IUD 07-28-16  Other Topics Concern   Not on file  Social History Narrative  Not on file   Social Determinants of Health   Financial Resource Strain: Not on file  Food Insecurity: Not on file  Transportation Needs: Not on file  Physical Activity: Not on file  Stress: Not on file  Social Connections: Not on file  Intimate Partner Violence: Not on file    Review of Systems  All other systems reviewed and are negative.  PHYSICAL EXAMINATION:    BP 110/76    Pulse 74    Ht 5' 1.75" (1.568 m)    Wt 157 lb (71.2 kg)    SpO2 98%    BMI 28.95 kg/m     General appearance: alert, cooperative and appears stated age  ASSESSMENT  Menopausal symptoms.  PLAN  We reviewed Paxil as a medication used to treat many conditions such as depression, anxiety, panic disorder, obsession-compulsion, and menopausal hot flashes and night sweats.  We discussed [place], an FDA approved form of Paroxetine in a lower dosage to treat vasomotor symptoms.  Potential side effects of  Paroxetine discussed also.  She will start Paxil 10 mg tonight.  Fu in 6 weeks.     An After Visit Summary was printed and given to the patient.

## 2021-10-06 ENCOUNTER — Encounter: Payer: Self-pay | Admitting: Obstetrics and Gynecology

## 2021-10-06 ENCOUNTER — Other Ambulatory Visit: Payer: Self-pay

## 2021-10-06 ENCOUNTER — Ambulatory Visit: Payer: Commercial Managed Care - PPO | Admitting: Obstetrics and Gynecology

## 2021-10-06 VITALS — BP 110/76 | HR 74 | Ht 61.75 in | Wt 157.0 lb

## 2021-10-06 DIAGNOSIS — N951 Menopausal and female climacteric states: Secondary | ICD-10-CM

## 2021-10-06 NOTE — Patient Instructions (Signed)
Paroxetine Tablets °What is this medication? °PAROXETINE (pa ROX e teen) treats depression, anxiety, obsessive-compulsive disorder (OCD), post-traumatic stress disorder (PTSD), and premenstrual dysphoric disorder (PMDD). It increases the amount of serotonin in the brain, a hormone that helps regulate mood. It belongs to a group of medications called SSRIs. °This medicine may be used for other purposes; ask your health care provider or pharmacist if you have questions. °COMMON BRAND NAME(S): Paxil, Pexeva °What should I tell my care team before I take this medication? °They need to know if you have any of these conditions: °Bipolar disorder or a family history of bipolar disorder °Bleeding disorders °Glaucoma °Heart disease °Kidney disease °Liver disease °Low levels of sodium in the blood °Seizures °Suicidal thoughts, plans, or attempt; a previous suicide attempt by you or a family member °Take MAOIs like Carbex, Eldepryl, Marplan, Nardil, and Parnate °Take medications that treat or prevent blood clots °Thyroid disease °An unusual or allergic reaction to paroxetine, other medications, foods, dyes, or preservatives °Pregnant or trying to get pregnant °Breast-feeding °How should I use this medication? °Take this medication by mouth with a glass of water. Follow the directions on the prescription label. You can take it with or without food. Take your medication at regular intervals. Do not take your medication more often than directed. Do not stop taking this medication suddenly except upon the advice of your care team. Stopping this medication too quickly may cause serious side effects or your condition may worsen. °A special MedGuide will be given to you by the pharmacist with each prescription and refill. Be sure to read this information carefully each time. °Talk to your care team regarding the use of this medication in children. Special care may be needed. °Overdosage: If you think you have taken too much of this  medicine contact a poison control center or emergency room at once. °NOTE: This medicine is only for you. Do not share this medicine with others. °What if I miss a dose? °If you miss a dose, take it as soon as you can. If it is almost time for your next dose, take only that dose. Do not take double or extra doses. °What may interact with this medication? °Do not take this medication with any of the following: °Linezolid °MAOIs like Carbex, Eldepryl, Marplan, Nardil, and Parnate °Methylene blue (injected into a vein) °Pimozide °Thioridazine °This medication may also interact with the following: °Alcohol °Amphetamines °Aspirin and aspirin-like medications °Atomoxetine °Certain medications for depression, anxiety, or psychotic disturbances °Certain medications for irregular heart beat like propafenone, flecainide, encainide, and quinidine °Certain medications for migraine headache like almotriptan, eletriptan, frovatriptan, naratriptan, rizatriptan, sumatriptan, zolmitriptan °Cimetidine °Digoxin °Diuretics °Fentanyl °Fosamprenavir °Furazolidone °Isoniazid °Lithium °Medications that treat or prevent blood clots like warfarin, enoxaparin, and dalteparin °Medications for sleep °NSAIDs, medications for pain and inflammation, like ibuprofen or naproxen °Phenobarbital °Phenytoin °Procarbazine °Rasagiline °Ritonavir °Supplements like St. John's wort, kava kava, valerian °Tamoxifen °Tramadol °Tryptophan °This list may not describe all possible interactions. Give your health care provider a list of all the medicines, herbs, non-prescription drugs, or dietary supplements you use. Also tell them if you smoke, drink alcohol, or use illegal drugs. Some items may interact with your medicine. °What should I watch for while using this medication? °Tell your care team if your symptoms do not get better or if they get worse. Visit your care team for regular checks on your progress. Because it may take several weeks to see the full  effects of this medication, it is important   to continue your treatment as prescribed by your care team. °Watch for new or worsening thoughts of suicide or depression. This includes sudden changes in mood, behaviors, or thoughts. These changes can happen at any time but are more common in the beginning of treatment or after a change in dose. Call your care team right away if you experience these thoughts or worsening depression. °Manic episodes may happen in patients with bipolar disorder who take this medication. Watch for changes in feelings or behaviors such as feeling anxious, nervous, agitated, panicky, irritable, hostile, aggressive, impulsive, severely restless, overly excited and hyperactive, or trouble sleeping. These changes can happen at any time but are more common in the beginning of treatment or after a change in dose. Call your care team right away if you notice any of these symptoms. °You may get drowsy or dizzy. Do not drive, use machinery, or do anything that needs mental alertness until you know how this medication affects you. Do not stand or sit up quickly, especially if you are an older patient. This reduces the risk of dizzy or fainting spells. Alcohol may interfere with the effect of this medication. Avoid alcoholic drinks. °Your mouth may get dry. Chewing sugarless gum or sucking hard candy, and drinking plenty of water will help. Contact your care team if the problem does not go away or is severe. °What side effects may I notice from receiving this medication? °Side effects that you should report to your care team as soon as possible: °Allergic reactions--skin rash, itching, hives, swelling of the face, lips, tongue, or throat °Bleeding--bloody or black, tar-like stools, red or dark brown urine, vomiting blood or brown material that looks like coffee grounds, small, red or purple spots on skin, unusual bleeding or bruising °Heart rhythm changes--fast or irregular heartbeat, dizziness,  feeling faint or lightheaded, chest pain, trouble breathing °Low sodium level--muscle weakness, fatigue, dizziness, headache, confusion °Serotonin syndrome--irritability, confusion, fast or irregular heartbeat, muscle stiffness, twitching muscles, sweating, high fever, seizures, chills, vomiting, diarrhea °Sudden eye pain or change in vision such as blurry vision, seeing halos around lights, vision loss °Thoughts of suicide or self-harm, worsening mood, feelings of depression °Side effects that usually do not require medical attention (report to your care team if they continue or are bothersome): °Change in sex drive or performance °Diarrhea °Excessive sweating °Nausea °Tremors or shaking °Upset stomach °This list may not describe all possible side effects. Call your doctor for medical advice about side effects. You may report side effects to FDA at 1-800-FDA-1088. °Where should I keep my medication? °Keep out of the reach of children and pets. °Store at room temperature between 15 and 30 degrees C (59 and 86 degrees F). Keep container tightly closed. Throw away any unused medication after the expiration date. °NOTE: This sheet is a summary. It may not cover all possible information. If you have questions about this medicine, talk to your doctor, pharmacist, or health care provider. °© 2022 [employer]/Gold [employer] (2020-08-13 00:00:00) ° °

## 2021-11-04 ENCOUNTER — Other Ambulatory Visit: Payer: Self-pay | Admitting: Obstetrics and Gynecology

## 2021-11-04 NOTE — Telephone Encounter (Signed)
MyChart msg sent re: refill if needed asaap or can wait until appt on 11/18/21.  ?

## 2021-11-16 NOTE — Telephone Encounter (Signed)
Last annual exam was 09/2020 ?Patient was scheduled for 11/18/21 for 6 week follow up, but appears she cancelled note placed on appointment "Patient (pt cancel appt said does not need)" ? ? ?Also on Rx in medication & orders in chart. Note put next to mediation "patient not taking as 10/06/2021" ? ?Unable to leave message for patient her voicemail is full. Rx denied, note attached to call office if refills needed.  ? ?She did schedule her annual exam on 08/2022 ?

## 2021-11-18 ENCOUNTER — Ambulatory Visit: Payer: Commercial Managed Care - PPO | Admitting: Obstetrics and Gynecology

## 2022-07-07 ENCOUNTER — Other Ambulatory Visit: Payer: Self-pay | Admitting: Obstetrics and Gynecology

## 2022-07-07 DIAGNOSIS — Z1231 Encounter for screening mammogram for malignant neoplasm of breast: Secondary | ICD-10-CM

## 2022-08-05 ENCOUNTER — Other Ambulatory Visit: Payer: Self-pay | Admitting: Obstetrics and Gynecology

## 2022-08-06 NOTE — Telephone Encounter (Signed)
Last AEX 08/24/2021--scheduled for 08/25/2022.  Pt did not start taking medication until after visit on 10/06/2021. Pt was recommended to f/u in 6 weeks after starting. Pt had appt scheduled for 11/18/2021. However, pt cancelled and notes stated "patient said she did not need."  Called pt to confirm if refill was needed. No answer, left detailed VM per DPR for pt to confirm.

## 2022-08-11 NOTE — Telephone Encounter (Signed)
Patient called back stating she does need a refill on Rx.   Left message for patient to call.

## 2022-08-11 NOTE — Telephone Encounter (Signed)
Patient has annual exam scheduled on 08/25/22 reports she will discuss with Dr.Silva at that visit.

## 2022-08-12 NOTE — Progress Notes (Signed)
51 y.o. G21P2002 Married [place] American female here for annual exam. Discuss menopause.  Having anxiety and depression, onset about 2 years ago when she switched her work.  Having fleeting thoughts.  Not remembering things, which has impacted her home and work.   She started Paxil in January, and this helps her night sweats.  Thinks the Paxil is helping her anxiety and depression.  If she skips a dosage, her symptoms increase.  Her hot flashes are manageable.   Denies suicidal ideation.   She has Mirena IUD placed in 2017.  FSH 84.5 and estradiol 25 on 08/24/21.   PCP:   Oliver Barre, MD  No LMP recorded. (Menstrual status: IUD).           Sexually active: No.  The current method of family planning is IUD--Mirena 07/28/16.    Exercising: No.   Smoker:  no  Health Maintenance: Pap:  08/24/21 ASCUS: negative HR HPV, , 07-18-19 Neg:Neg HR HPV History of abnormal Pap:  yes, 06-09-17 colpo, ECC benign. 04/08/17 LGSIL:Neg HR HPV.  LEEP 06/09/16 - LEEP specimen showing LGSIL. Colposcopy 04-16-16 unsatisfactory.  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  Margins were negative. Pap 03/08/16 showing LGSIL and negative HR HPV.Pap 08/09/14 ASCUS, negative HR HPV.   MMG:  10/27/20, Breast Density Category C, BI-RADS CATEGORY 1 Negative.  Has appointment on 09/02/22.  Colonoscopy:  2017? normal, next 10 years per patient. BMD:   n/a  Result  n/a TDaP:  04/08/17 Gardasil:   no HIV: neg in preg Hep C: no Screening Labs:  PCP Flu vaccine:  declines   reports that she has never smoked. She has never used smokeless tobacco. She reports that she does not drink alcohol and does not use drugs.  Past Medical History:  Diagnosis Date   ALLERGIC RHINITIS 10/18/2008   ANEMIA-IRON DEFICIENCY 04/30/2008   ANEMIA-NOS 04/30/2008   COLONIC POLYPS, HX OF 04/30/2008    Past Surgical History:  Procedure Laterality Date   CESAREAN SECTION  x2   INTRAUTERINE DEVICE INSERTION     07-28-2016 inserted     Current Outpatient Medications  Medication Sig Dispense Refill   estradiol (ESTRACE) 0.1 MG/GM vaginal cream USE 1 GRAM VAGINALLY 2 TIMES EVERY WEEK 42.5 g 1   fexofenadine (ALLEGRA) 180 MG tablet Take 180 mg by mouth daily.     levonorgestrel (MIRENA) 20 MCG/24HR IUD 1 each by Intrauterine route once.     PARoxetine (PAXIL) 20 MG tablet Take 1 tablet (20 mg total) by mouth daily. 90 tablet 3   No current facility-administered medications for this visit.    Family History  Problem Relation Age of Onset   Hypertension Mother    Clotting disorder Father    Alcoholism Father     Review of Systems  Endocrine:       Hot flashes  Psychiatric/Behavioral:  Positive for confusion. The patient is nervous/anxious.   All other systems reviewed and are negative.   Exam:   Ht 5' 1.75" (1.568 m)   Wt 158 lb (71.7 kg)   BMI 29.13 kg/m     General appearance: alert, cooperative and appears stated age. Flat affect.  Head: normocephalic, without obvious abnormality, atraumatic Neck: no adenopathy, supple, symmetrical, trachea midline and thyroid normal to inspection and palpation Lungs: clear to auscultation bilaterally Breasts: normal appearance, no masses or tenderness, No nipple retraction or dimpling, No nipple discharge or bleeding, No axillary adenopathy Heart: regular rate and rhythm Abdomen: soft, non-tender; no masses, no  organomegaly Extremities: extremities normal, atraumatic, no cyanosis or edema Skin: skin color, texture, turgor normal. No rashes or lesions Lymph nodes: cervical, supraclavicular, and axillary nodes normal. Neurologic: grossly normal  Pelvic: External genitalia:  no lesions              No abnormal inguinal nodes palpated.              Urethra:  normal appearing urethra with no masses, tenderness or lesions              Bartholins and Skenes: normal                 Vagina: normal appearing vagina with normal color and discharge, no lesions               Cervix: no lesions.  IUD strings noted.               Pap taken: yes. Bimanual Exam:  Uterus:  normal size, contour, position, consistency, mobility, non-tender              Adnexa: no mass, fullness, tenderness             Chaperone was present for exam:  Irving Burton.  Assessment:   Well woman visit with gynecologic exam. Hx LGSIL.  Status post LEEP.   LGSIL. Mirena IUD.  Placed in 2017.  Cognitive change.  Anxiety and depression.   Plan: Mammogram screening discussed. Self breast awareness reviewed. Pap and HR HPV collected. Guidelines for Calcium, Vitamin D, regular exercise program including cardiovascular and weight bearing exercise. Consider HRT after mammogram back.   Her IUD would need removal.  I would recommend a course of Cytotec 200 mcg pv at hs the night before removal and the am of removal. Transdermal estradiol and Prometrium are considerations.  Refer to neurology for cognitive evaluation.  Increase Paxil to 20 mg daily.  She will see her PCP also.  Follow up annually and prn.   After visit summary provided.

## 2022-08-25 ENCOUNTER — Ambulatory Visit (INDEPENDENT_AMBULATORY_CARE_PROVIDER_SITE_OTHER): Payer: Commercial Managed Care - PPO | Admitting: Obstetrics and Gynecology

## 2022-08-25 ENCOUNTER — Other Ambulatory Visit (HOSPITAL_COMMUNITY)
Admission: RE | Admit: 2022-08-25 | Discharge: 2022-08-25 | Disposition: A | Discharge status: Home / Self Care | Payer: Commercial Managed Care - PPO | Source: Ambulatory Visit | Attending: Obstetrics and Gynecology | Admitting: Obstetrics and Gynecology

## 2022-08-25 ENCOUNTER — Encounter: Payer: Self-pay | Admitting: Obstetrics and Gynecology

## 2022-08-25 VITALS — Ht 61.75 in | Wt 158.0 lb

## 2022-08-25 DIAGNOSIS — Z01419 Encounter for gynecological examination (general) (routine) without abnormal findings: Secondary | ICD-10-CM | POA: Diagnosis not present

## 2022-08-25 DIAGNOSIS — R4189 Other symptoms and signs involving cognitive functions and awareness: Secondary | ICD-10-CM

## 2022-08-25 DIAGNOSIS — Z124 Encounter for screening for malignant neoplasm of cervix: Secondary | ICD-10-CM | POA: Insufficient documentation

## 2022-08-25 DIAGNOSIS — F32A Depression, unspecified: Secondary | ICD-10-CM

## 2022-08-25 DIAGNOSIS — F419 Anxiety disorder, unspecified: Secondary | ICD-10-CM

## 2022-08-25 DIAGNOSIS — Z87410 Personal history of cervical dysplasia: Secondary | ICD-10-CM

## 2022-08-25 MED ORDER — PAROXETINE HCL 20 MG PO TABS: 20.0000 mg | ORAL_TABLET | Freq: Every day | ORAL | 3 refills | Status: DC

## 2022-08-25 NOTE — Patient Instructions (Signed)

## 2022-09-01 LAB — CYTOLOGY - PAP
Comment: NEGATIVE
Diagnosis: UNDETERMINED — AB
High risk HPV: NEGATIVE

## 2022-09-02 ENCOUNTER — Ambulatory Visit
Admission: RE | Admit: 2022-09-02 | Discharge: 2022-09-02 | Disposition: A | Discharge status: Home / Self Care | Payer: Commercial Managed Care - PPO | Source: Ambulatory Visit | Attending: Obstetrics and Gynecology | Admitting: Obstetrics and Gynecology

## 2022-09-02 DIAGNOSIS — Z1231 Encounter for screening mammogram for malignant neoplasm of breast: Secondary | ICD-10-CM

## 2022-09-07 ENCOUNTER — Encounter: Payer: Self-pay | Admitting: Obstetrics and Gynecology

## 2022-09-07 NOTE — Telephone Encounter (Signed)
[facility] left messages for patient to call on 08/25/2022 and this morning at 10:53am.

## 2022-09-07 NOTE — Telephone Encounter (Signed)
Sarah Riley for Rx for HRT:   - Vivelle Dot 0.05 mg transdermal patch to skin twice weekly.  Disp: 24, RF:  none.  - Prometrium 100 mg capsule po q hs.  Disp:  90, RF:  none.   Her IUD will not be able to provide the progesterone side of her HRT, so the Prometrium is now recommended.   I recommend an office visit in 3 months to see her response to the HRT.  Please schedule this appointment.   Eventually, we will need to plan for IUD removal.   Let her know that [facility] is trying to schedule an appointment for her.  If she does not respond to them, they may close the referral.

## 2022-09-08 MED ORDER — ESTRADIOL 0.05 MG/24HR TD PTTW: 1.0000 | MEDICATED_PATCH | TRANSDERMAL | 0 refills | Status: DC

## 2022-09-08 MED ORDER — PROGESTERONE MICRONIZED 100 MG PO CAPS: 100.0000 mg | ORAL_CAPSULE | Freq: Every day | ORAL | 0 refills | Status: DC

## 2022-10-27 ENCOUNTER — Encounter: Payer: Self-pay | Admitting: Neurology

## 2022-10-27 ENCOUNTER — Ambulatory Visit: Payer: Commercial Managed Care - PPO | Admitting: Neurology

## 2022-10-27 VITALS — BP 135/90 | HR 73 | Ht 62.0 in | Wt 162.5 lb

## 2022-10-27 DIAGNOSIS — Z780 Asymptomatic menopausal state: Secondary | ICD-10-CM

## 2022-10-27 DIAGNOSIS — R4189 Other symptoms and signs involving cognitive functions and awareness: Secondary | ICD-10-CM

## 2022-10-27 NOTE — Patient Instructions (Signed)
Set up care with [place] primary care doctor Set up care with a psychiatrist Continue with your medication Return as needed

## 2022-10-27 NOTE — Progress Notes (Deleted)
52 y.o. G53P2002 Married [place] American female here for med check f/u.    PCP:     No LMP recorded. (Menstrual status: IUD).           Sexually active: {yes no:314532}  The current method of family planning is IUD--Mirena 07/28/16.    Exercising: {yes no:314532}  {types:19826} Smoker:  no  Health Maintenance: Pap:  08/25/22 ASCUS: HR HPV neg, 08/24/21 ASCUS: HR HPV neg History of abnormal Pap:  yes, 06-09-17 colpo, ECC benign. 04/08/17 LGSIL:Neg HR HPV.  LEEP 06/09/16 - LEEP specimen showing LGSIL. Colposcopy 04-16-16 unsatisfactory.  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  Margins were negative. Pap 03/08/16 showing LGSIL and negative HR HPV.Pap 08/09/14 ASCUS, negative HR HPV.    MMG:  09/02/22 Breast Density Category B, BI-RADS CATEGORY 1 neg Colonoscopy:  2017 normal per pt BMD:   n/a  Result  n/a TDaP:  04/08/17 Gardasil:   no HIV: neg in preg Hep C: neg in preg Screening Labs:  Hb today: ***, Urine today: ***   reports that she has never smoked. She has never used smokeless tobacco. She reports that she does not drink alcohol and does not use drugs.  Past Medical History:  Diagnosis Date   ALLERGIC RHINITIS 10/18/2008   ANEMIA-IRON DEFICIENCY 04/30/2008   ANEMIA-NOS 04/30/2008   COLONIC POLYPS, HX OF 04/30/2008    Past Surgical History:  Procedure Laterality Date   CESAREAN SECTION  x2   INTRAUTERINE DEVICE INSERTION     07-28-2016 inserted    Current Outpatient Medications  Medication Sig Dispense Refill   estradiol (ESTRACE) 0.1 MG/GM vaginal cream USE 1 GRAM VAGINALLY 2 TIMES EVERY WEEK 42.5 g 1   estradiol (VIVELLE-DOT) 0.05 MG/24HR patch Place 1 patch (0.05 mg total) onto the skin 2 (two) times a week. 24 patch 0   fexofenadine (ALLEGRA) 180 MG tablet Take 180 mg by mouth daily.     levonorgestrel (MIRENA) 20 MCG/24HR IUD 1 each by Intrauterine route once.     PARoxetine (PAXIL) 20 MG tablet Take 1 tablet (20 mg total) by mouth daily. 90 tablet 3    progesterone (PROMETRIUM) 100 MG capsule Take 1 capsule (100 mg total) by mouth at bedtime. 90 capsule 0   No current facility-administered medications for this visit.    Family History  Problem Relation Age of Onset   Hypertension Mother    Clotting disorder Father    Alcoholism Father     Review of Systems  Exam:   There were no vitals taken for this visit.    General appearance: alert, cooperative and appears stated age Head: normocephalic, without obvious abnormality, atraumatic Neck: no adenopathy, supple, symmetrical, trachea midline and thyroid normal to inspection and palpation Lungs: clear to auscultation bilaterally Breasts: normal appearance, no masses or tenderness, No nipple retraction or dimpling, No nipple discharge or bleeding, No axillary adenopathy Heart: regular rate and rhythm Abdomen: soft, non-tender; no masses, no organomegaly Extremities: extremities normal, atraumatic, no cyanosis or edema Skin: skin color, texture, turgor normal. No rashes or lesions Lymph nodes: cervical, supraclavicular, and axillary nodes normal. Neurologic: grossly normal  Pelvic: External genitalia:  no lesions              No abnormal inguinal nodes palpated.              Urethra:  normal appearing urethra with no masses, tenderness or lesions              Bartholins and  Skenes: normal                 Vagina: normal appearing vagina with normal color and discharge, no lesions              Cervix: no lesions              Pap taken: {yes no:314532} Bimanual Exam:  Uterus:  normal size, contour, position, consistency, mobility, non-tender              Adnexa: no mass, fullness, tenderness              Rectal exam: {yes no:314532}.  Confirms.              Anus:  normal sphincter tone, no lesions  Chaperone was present for exam:  ***  Assessment:   Well woman visit with gynecologic exam.   Plan: Mammogram screening discussed. Self breast awareness reviewed. Pap and HR HPV as  above. Guidelines for Calcium, Vitamin D, regular exercise program including cardiovascular and weight bearing exercise.   Follow up annually and prn.   Additional counseling given.  {yes [id number]. _______ minutes face to face time of which over 50% was spent in counseling.    After visit summary provided.

## 2022-10-27 NOTE — Progress Notes (Signed)
[facility]  PATIENT: Sarah Riley DOB: [birth year]  REQUESTING CLINICIAN: Aundria Rud* HISTORY FROM: Patient  REASON FOR VISIT: Cognitive complaints    HISTORICAL  CHIEF COMPLAINT:  Chief Complaint  Patient presents with   New Patient (Initial Visit)    Rm 12. Patient alone, patient reports being menopausal and symptoms of forgetfulness, trouble concentrating and brain fog increased with the menopause    HISTORY OF PRESENT ILLNESS:  This is a 52 year old woman past medical history of anxiety, depression, who is presenting with increased forgetfulness, increasing anxiety and depression for the past 2 years.  Patient reports that her symptoms coincide with the start of her menopause and since then she is having more anxiety symptoms, difficulty performing her job and being forgetful.  Her sleep is interrupted, she wakes up in the middle of the night and unable to fall back asleep. She reported at her job he has been recommended for her work.  Her husband also complained about her memory. She has seen her OB/GYN who started her on Paxil, recently increased to 20 mg daily.  Patient reports with the increase she has seen minimal improvement.  She has a long history of anxiety and depression and states that she has never been treated by a psychiatrist.  At 1 point he did therapy but not currently.   TBI:   No past history of TBI Stroke:   no past history of stroke Seizures:   no past history of seizures Sleep:   no history of sleep apnea. Mood: Yes, diagnosed with anxiety and depression Family history of Dementia:  Denies  Functional status: independent in all ADLs and IADLs Patient lives with children and husband. Cooking: yes , left the stove on Cleaning: patient Shopping: patient Bathing: patient Toileting: patient Driving: patient, no recent accident, has not been lost Bills: Yes, have been late because being forgetful  Medications:  Paxil Ever left the stove on by accident?: Yes  Forget how to use items around the house?: Denies  Getting lost going to familiar places?: Denies  Forgetting loved ones names?: Denies  Word finding difficulty? Yes, will get anxious  Sleep: Interrupted, mind racing, sometimes wakes up and unable to fall asleep.    OTHER MEDICAL CONDITIONS: Anxiety/Depression    REVIEW OF SYSTEMS: Full 14 system review of systems performed and negative with exception of: as noted in the HPI   ALLERGIES: Allergies  Allergen Reactions   Oxycodone Other (See Comments)    Feels loopy    HOME MEDICATIONS: Outpatient Medications Prior to Visit  Medication Sig Dispense Refill   estradiol (ESTRACE) 0.1 MG/GM vaginal cream USE 1 GRAM VAGINALLY 2 TIMES EVERY WEEK 42.5 g 1   estradiol (VIVELLE-DOT) 0.05 MG/24HR patch Place 1 patch (0.05 mg total) onto the skin 2 (two) times a week. 24 patch 0   fexofenadine (ALLEGRA) 180 MG tablet Take 180 mg by mouth daily.     levonorgestrel (MIRENA) 20 MCG/24HR IUD 1 each by Intrauterine route once.     PARoxetine (PAXIL) 20 MG tablet Take 1 tablet (20 mg total) by mouth daily. 90 tablet 3   progesterone (PROMETRIUM) 100 MG capsule Take 1 capsule (100 mg total) by mouth at bedtime. 90 capsule 0   No facility-administered medications prior to visit.    PAST MEDICAL HISTORY: Past Medical History:  Diagnosis Date   ALLERGIC RHINITIS 10/18/2008   ANEMIA-IRON DEFICIENCY 04/30/2008   ANEMIA-NOS 04/30/2008   COLONIC POLYPS, HX OF 04/30/2008  PAST SURGICAL HISTORY: Past Surgical History:  Procedure Laterality Date   CESAREAN SECTION  x2   INTRAUTERINE DEVICE INSERTION     07-28-2016 inserted    FAMILY HISTORY: Family History  Problem Relation Age of Onset   Hypertension Mother    Clotting disorder Father    Alcoholism Father     SOCIAL HISTORY: Social History   Socioeconomic History   Marital status: Married    Spouse name: Not on file   Number of  children: Not on file   Years of education: Not on file   Highest education level: Not on file  Occupational History   Not on file  Tobacco Use   Smoking status: Never   Smokeless tobacco: Never  Vaping Use   Vaping Use: Never used  Substance and Sexual Activity   Alcohol use: No    Alcohol/week: 0.0 standard drinks of alcohol   Drug use: No   Sexual activity: Yes    Partners: Male    Birth control/protection: I.U.D.    Comment: Mirena IUD 07-28-16  Other Topics Concern   Not on file  Social History Narrative   Not on file   Social Determinants of Health   Financial Resource Strain: Not on file  Food Insecurity: Not on file  Transportation Needs: Not on file  Physical Activity: Not on file  Stress: Not on file  Social Connections: Not on file  Intimate Partner Violence: Not on file    PHYSICAL EXAM  GENERAL EXAM/CONSTITUTIONAL: Vitals:  Vitals:   10/27/22 1425 10/27/22 1426  BP: (!) 150/93 (!) 135/90  Pulse: 77 73  Weight: 162 lb 8 oz (73.7 kg)   Height: 5' 2"$  (1.575 m)    Body mass index is 29.72 kg/m. Wt Readings from Last 3 Encounters:  10/27/22 162 lb 8 oz (73.7 kg)  08/25/22 158 lb (71.7 kg)  10/06/21 157 lb (71.2 kg)   Patient is in no distress; well developed, nourished and groomed; neck is supple   EYES: Visual fields full to confrontation, Extraocular movements intacts,   MUSCULOSKELETAL: Gait, strength, tone, movements noted in Neurologic exam below  NEUROLOGIC: MENTAL STATUS:      No data to display            10/27/2022    2:27 PM  Montreal Cognitive Assessment   Visuospatial/ Executive (0/5) 5  Naming (0/3) 3  Attention: Read list of digits (0/2) 2  Attention: Read list of letters (0/1) 1  Attention: Serial 7 subtraction starting at 100 (0/3) 2  Language: Repeat phrase (0/2) 2  Language : Fluency (0/1) 1  Abstraction (0/2) 2  Delayed Recall (0/5) 3  Orientation (0/6) 5  Total 26  Adjusted Score (based on education) 26      CRANIAL NERVE:  2nd, 3rd, 4th, 6th- visual fields full to confrontation, extraocular muscles intact, no nystagmus 5th - facial sensation symmetric 7th - facial strength symmetric 8th - hearing intact 9th - palate elevates symmetrically, uvula midline 11th - shoulder shrug symmetric 12th - tongue protrusion midline  MOTOR:  normal bulk and tone, full strength in the BUE, BLE  SENSORY:  normal and symmetric to light touch  COORDINATION:  finger-nose-finger, fine finger movements normal  GAIT/STATION:  normal   DIAGNOSTIC DATA (LABS, IMAGING, TESTING) - I reviewed patient records, labs, notes, testing and imaging myself where available.  Lab Results  Component Value Date   WBC 5.8 08/24/2021   HGB 14.4 08/24/2021   HCT 42.9 08/24/2021  MCV 98.2 08/24/2021   PLT 224 08/24/2021      Component Value Date/Time   NA 139 08/24/2021 1526   NA 138 07/18/2019 1525   K 4.2 08/24/2021 1526   CL 104 08/24/2021 1526   CO2 25 08/24/2021 1526   GLUCOSE 71 08/24/2021 1526   BUN 8 08/24/2021 1526   BUN 11 07/18/2019 1525   CREATININE 0.90 08/24/2021 1526   CALCIUM 9.5 08/24/2021 1526   PROT 8.0 08/24/2021 1526   PROT 7.3 07/18/2019 1525   ALBUMIN 4.0 07/18/2019 1525   AST 23 08/24/2021 1526   ALT 21 08/24/2021 1526   ALKPHOS 61 07/18/2019 1525   BILITOT 0.5 08/24/2021 1526   BILITOT 0.4 07/18/2019 1525   GFRNONAA 80 07/18/2019 1525   GFRAA 92 07/18/2019 1525   Lab Results  Component Value Date   CHOL 205 (H) 08/24/2021   HDL 54 08/24/2021   LDLCALC 136 (H) 08/24/2021   TRIG 60 08/24/2021   CHOLHDL 3.8 08/24/2021   No results found for: "HGBA1C" No results found for: "VITAMINB12" No results found for: "TSH"   ASSESSMENT AND PLAN  52 y.o. year old female with history of anxiety, depression who is presenting with memory complaints, described described as being forgetful, taking more time to perform her job.  She reports also worsening of anxiety, difficulty  with sleep since starting menopause.  Her MoCA was normal.  I have informed patient that her memory complaints are most likely related to her untreated anxiety depression and her symptoms associated with menopause.  My recommendations for her are to set up care with a primary care doctor and also to set up care with a psychiatrist.  She will need treatment for her ongoing anxiety and depression with medication and also cognitive behavioral therapy.  She voiced understanding.  Return as needed.   1. Subjective memory complaints   2. Menopause      Patient Instructions  Set up care with [place] primary care doctor Set up care with a psychiatrist Continue with your medication Return as needed  No orders of the defined types were placed in this encounter.   No orders of the defined types were placed in this encounter.   Return if symptoms worsen or fail to improve.    Alric Ran, MD 10/27/2022, 3:01 PM  [facility] [address], [place], [place] [address] [phone]

## 2022-11-09 ENCOUNTER — Ambulatory Visit: Payer: Commercial Managed Care - PPO | Admitting: Obstetrics and Gynecology

## 2022-11-22 ENCOUNTER — Ambulatory Visit: Payer: Commercial Managed Care - PPO | Admitting: Internal Medicine

## 2022-11-22 ENCOUNTER — Encounter: Payer: Self-pay | Admitting: Internal Medicine

## 2022-11-22 VITALS — BP 120/78 | HR 73 | Temp 98.1°F | Ht 62.0 in | Wt 162.0 lb

## 2022-11-22 DIAGNOSIS — J069 Acute upper respiratory infection, unspecified: Secondary | ICD-10-CM | POA: Diagnosis not present

## 2022-11-22 LAB — POCT RESPIRATORY SYNCYTIAL VIRUS: RSV Rapid Ag: NEGATIVE

## 2022-11-22 LAB — POCT INFLUENZA A/B
Influenza A, POC: NEGATIVE
Influenza B, POC: NEGATIVE

## 2022-11-22 LAB — POC COVID19 BINAXNOW: SARS Coronavirus 2 Ag: NEGATIVE

## 2022-11-22 MED ORDER — DOXYCYCLINE HYCLATE 100 MG PO TABS: 100.0000 mg | ORAL_TABLET | Freq: Two times a day (BID) | ORAL | 0 refills | Status: DC

## 2022-11-22 NOTE — Progress Notes (Signed)
Patient ID: Sarah Riley, female   DOB: 04-May-1971, 52 y.o.   MRN: [id number]        Chief Complaint: follow up prod cough and congestion, ST       HPI:  Sarah Riley is a 52 y.o. female here with c/o 2 days onset now severe ST,, congestion, HA, fatigue and maliase and feeling warm, Pt denies chest pain, increased sob or doe, wheezing, orthopnea, PND, increased LE swelling, palpitations, dizziness or syncope.   Pt denies polydipsia, polyuria, or new focal neuro s/s.    Pt denies other recent wt loss, night sweats, loss of appetite, or other constitutional symptoms       Wt Readings from Last 3 Encounters:  11/22/22 162 lb (73.5 kg)  10/27/22 162 lb 8 oz (73.7 kg)  08/25/22 158 lb (71.7 kg)   BP Readings from Last 3 Encounters:  11/22/22 120/78  10/27/22 (!) 135/90  10/06/21 110/76         Past Medical History:  Diagnosis Date   ALLERGIC RHINITIS 10/18/2008   ANEMIA-IRON DEFICIENCY 04/30/2008   ANEMIA-NOS 04/30/2008   COLONIC POLYPS, HX OF 04/30/2008   Past Surgical History:  Procedure Laterality Date   CESAREAN SECTION  x2   INTRAUTERINE DEVICE INSERTION     07-28-2016 inserted    reports that she has never smoked. She has never used smokeless tobacco. She reports that she does not drink alcohol and does not use drugs. family history includes Alcoholism in her father; Clotting disorder in her father; Hypertension in her mother. Allergies  Allergen Reactions   Oxycodone Other (See Comments)    Feels loopy   Current Outpatient Medications on File Prior to Visit  Medication Sig Dispense Refill   estradiol (ESTRACE) 0.1 MG/GM vaginal cream USE 1 GRAM VAGINALLY 2 TIMES EVERY WEEK 42.5 g 1   estradiol (VIVELLE-DOT) 0.05 MG/24HR patch Place 1 patch (0.05 mg total) onto the skin 2 (two) times a week. 24 patch 0   fexofenadine (ALLEGRA) 180 MG tablet Take 180 mg by mouth daily.     levonorgestrel (MIRENA) 20 MCG/24HR IUD 1 each by Intrauterine route once.     PARoxetine  (PAXIL) 20 MG tablet Take 1 tablet (20 mg total) by mouth daily. 90 tablet 3   progesterone (PROMETRIUM) 100 MG capsule Take 1 capsule (100 mg total) by mouth at bedtime. 90 capsule 0   No current facility-administered medications on file prior to visit.        ROS:  All others reviewed and negative.  Objective        PE:  BP 120/78 (BP Location: Left Arm, Patient Position: Sitting, Cuff Size: Normal)   Pulse 73   Temp 98.1 F (36.7 C) (Oral)   Ht 5\' 2"  (1.575 m)   Wt 162 lb (73.5 kg)   SpO2 100%   BMI 29.63 kg/m                 Constitutional: Pt appears mild ill               HENT: Head: NCAT.                Right Ear: External ear normal.                 Left Ear: External ear normal. Bilat tm's with mild erythema.  Max sinus areas non tender.  Pharynx with severe erythema, enlarged tonsils right > left with exudate  Eyes: . Pupils are equal, round, and reactive to light. Conjunctivae and EOM are normal               Nose: without d/c or deformity               Neck: Neck supple. Gross normal ROM               Cardiovascular: Normal rate and regular rhythm.                 Pulmonary/Chest: Effort normal and breath sounds without rales or wheezing.                               Neurological: Pt is alert. At baseline orientation, motor grossly intact               Skin: Skin is warm. No rashes, no other new lesions, LE edema - none               Psychiatric: Pt behavior is normal without agitation   Micro: none  Cardiac tracings I have personally interpreted today:  none  Pertinent Radiological findings (summarize): none   Lab Results  Component Value Date   WBC 5.8 08/24/2021   HGB 14.4 08/24/2021   HCT 42.9 08/24/2021   PLT 224 08/24/2021   GLUCOSE 71 08/24/2021   CHOL 205 (H) 08/24/2021   TRIG 60 08/24/2021   HDL 54 08/24/2021   LDLCALC 136 (H) 08/24/2021   ALT 21 08/24/2021   AST 23 08/24/2021   NA 139 08/24/2021   K 4.2 08/24/2021   CL 104  08/24/2021   CREATININE 0.90 08/24/2021   BUN 8 08/24/2021   CO2 25 08/24/2021   POCT  - COVID neg, Flu - neg, RSV - neg  Assessment/Plan:  Sarah Riley is a 52 y.o. Black or African American [2] female with  has a past medical history of ALLERGIC RHINITIS (10/18/2008), ANEMIA-IRON DEFICIENCY (04/30/2008), ANEMIA-NOS (04/30/2008), and COLONIC POLYPS, HX OF (04/30/2008).  Acute upper respiratory infection Viral studies neg, likely strep infeciton, for antibiotic doxycycline 100 bid  Followup: Return if symptoms worsen or fail to improve.  Cathlean Cower, MD 11/22/2022 8:08 PM [facility]

## 2022-11-22 NOTE — Patient Instructions (Addendum)
Your COVID, Flu, and RSV testing was done today  Please take all new medication as prescribed - the antibiotic for probable strep throat  You can also take Delsym OTC for cough, and/or Mucinex (or it's generic off brand) for congestion, and tylenol as needed for pain.   Please continue all other medications as before, and refills have been done if requested.  Please have the pharmacy call with any other refills you may need.  Please keep your appointments with your specialists as you may have planned

## 2022-11-22 NOTE — Assessment & Plan Note (Signed)
Viral studies neg, likely strep infeciton, for antibiotic doxycycline 100 bid

## 2023-08-08 ENCOUNTER — Ambulatory Visit: Payer: Medicaid Other | Admitting: Obstetrics and Gynecology

## 2023-08-09 NOTE — Progress Notes (Signed)
52 y.o. G16P2002 Married [place] American female here for annual exam.    Stopped Paxil and HRT. She ran out and and she did not have insurance temporarily.  HRT helped with hot flashes, depression and decreased focus.  Paxil 20 mg helped her depression and focus.  She wants to restart all of these.   No vaginal bleeding.  Has been using several Mirena IUDs over the years.  FSH 84.5 on 08/24/21.   Urine is dark color.  No dysuria, pain, or burning.   Lost her job and found new employment which will begin this month.   PCP: Corwin Levins, MD   No LMP recorded. (Menstrual status: IUD).           Sexually active: Yes.    The current method of family planning is IUD--Mirena 07/28/16.    Menopausal hormone therapy:  estrace, vivelle-dot, progesterone Exercising: No.     Smoker:  no  OB History  Gravida Para Term Preterm AB Living  2 2 2   2   SAB IAB Ectopic Multiple Live Births          # Outcome Date GA Lbr Len/2nd Weight Sex Type Anes PTL Lv  2 Term 09/2005 [redacted]w[redacted]d  7 lb 14 oz (3.572 kg) F CS-Unspec     1 Term 09/2003 [redacted]w[redacted]d  7 lb 1 oz (3.204 kg) M CS-Unspec        HEALTH MAINTENANCE: Last 2 paps:  08/25/22 ASCUS: HR HPV neg, 08/24/21 ASCUS: HR HPV neg History of abnormal Pap or positive HPV:  yes, 06-09-17 colpo, ECC benign. 04/08/17 LGSIL:Neg HR HPV.  LEEP 06/09/16 - LEEP specimen showing LGSIL. Colposcopy 04-16-16 unsatisfactory.  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  Margins were negative. Pap 03/08/16 showing LGSIL and negative HR HPV.Pap 08/09/14 ASCUS, negative HR HPV.   Mammogram:   scheduling for September 01, 2024, 09/02/22 Breast Density cat B, BI-RADS CAT 1 neg Colonoscopy:  2017 normal - due in 2027.  Bone Density:  n/a  Result  n/a   Immunization History  Administered Date(s) Administered   Influenza,inj,Quad PF,6+ Mos 08/24/2021   Tdap 04/08/2017      reports that she has never smoked. She has never used smokeless tobacco. She reports that she  does not drink alcohol and does not use drugs.  Past Medical History:  Diagnosis Date   ALLERGIC RHINITIS 10/18/2008   ANEMIA-IRON DEFICIENCY 04/30/2008   ANEMIA-NOS 04/30/2008   COLONIC POLYPS, HX OF 04/30/2008    Past Surgical History:  Procedure Laterality Date   CESAREAN SECTION  x2   INTRAUTERINE DEVICE INSERTION     07-28-2016 inserted    Current Outpatient Medications  Medication Sig Dispense Refill   estradiol (ESTRACE) 0.1 MG/GM vaginal cream USE 1 GRAM VAGINALLY 2 TIMES EVERY WEEK 42.5 g 1   estradiol (VIVELLE-DOT) 0.05 MG/24HR patch Place 1 patch (0.05 mg total) onto the skin 2 (two) times a week. 24 patch 0   fexofenadine (ALLEGRA) 180 MG tablet Take 180 mg by mouth daily.     levonorgestrel (MIRENA) 20 MCG/24HR IUD 1 each by Intrauterine route once.     PARoxetine (PAXIL) 20 MG tablet Take 1 tablet (20 mg total) by mouth daily. 90 tablet 3   progesterone (PROMETRIUM) 100 MG capsule Take 1 capsule (100 mg total) by mouth at bedtime. 90 capsule 0   No current facility-administered medications for this visit.    ALLERGIES: Oxycodone  Family History  Problem Relation Age of Onset  Hypertension Mother    Clotting disorder Father    Alcoholism Father     Review of Systems  All other systems reviewed and are negative.   PHYSICAL EXAM:  BP 124/76 (BP Location: Left Arm, Patient Position: Sitting, Cuff Size: Small)   Pulse 64   Ht 5' 3.75" (1.619 m)   Wt 169 lb (76.7 kg)   SpO2 98%   BMI 29.24 kg/m     General appearance: alert, cooperative and appears stated age Head: normocephalic, without obvious abnormality, atraumatic Neck: no adenopathy, supple, symmetrical, trachea midline and thyroid normal to inspection and palpation Lungs: clear to auscultation bilaterally Breasts: normal appearance, no masses or tenderness, No nipple retraction or dimpling, No nipple discharge or bleeding, No axillary adenopathy Heart: regular rate and rhythm Abdomen: soft,  non-tender; no masses, no organomegaly Extremities: extremities normal, atraumatic, no cyanosis or edema Skin: skin color, texture, turgor normal. No rashes or lesions Lymph nodes: cervical, supraclavicular, and axillary nodes normal. Neurologic: grossly normal  Pelvic: External genitalia:  no lesions              No abnormal inguinal nodes palpated.              Urethra:  normal appearing urethra with no masses, tenderness or lesions              Bartholins and Skenes: normal                 Vagina: normal appearing vagina with normal color and discharge, no lesions              Cervix: no lesions.  IUD strings noted.               Pap taken: Yes.   Bimanual Exam:  Uterus:  normal size, contour, position, consistency, mobility, non-tender              Adnexa: no mass, fullness, tenderness              Rectal exam: Declined.  Chaperone was present for exam:  Warren Lacy, CMA  ASSESSMENT: Well woman visit with gynecologic exam. Menopausal symptoms, improved on HRT. Hx LGSIL.  Status post LEEP.   LGSIL. Mirena IUD.  Placed in 2017.  Due for removal in November, 2025.  Anxiety and depression.  Dark colored urine.  PLAN: Mammogram screening discussed. Self breast awareness reviewed. Pap and HRV collected:  Yes.   Guidelines for Calcium, Vitamin D, regular exercise program including cardiovascular and weight bearing exercise.Discused WHI and use of HRT which can increase risk of PE, DVT, MI, stroke and breast cancer.  Medication refills:  Vivelle Dot, Prometrium, vaginal estradiol cream, Paxil.  Urinalysis:  sg 1.025, ph 5.5, 0 - 2 WBC, 3 - 10 RBC, 6 - 10 epis, moderate bacteria, moderate mucus. Reflex UC sent. No Abx given.  Will wait for final UC.  FSH and estradiol.  I recommend a course of Cytotec for removal of her IUD.  Her cervix is a challenge to visualize with speculum exam.  Follow up:  1 year and prn.

## 2023-08-23 ENCOUNTER — Encounter: Payer: Self-pay | Admitting: Obstetrics and Gynecology

## 2023-08-23 ENCOUNTER — Other Ambulatory Visit: Payer: Self-pay | Admitting: Obstetrics and Gynecology

## 2023-08-23 ENCOUNTER — Other Ambulatory Visit (HOSPITAL_COMMUNITY)
Admission: RE | Admit: 2023-08-23 | Discharge: 2023-08-23 | Disposition: A | Discharge status: Home / Self Care | Payer: Medicaid Other | Source: Ambulatory Visit | Attending: Obstetrics and Gynecology | Admitting: Obstetrics and Gynecology

## 2023-08-23 ENCOUNTER — Ambulatory Visit: Payer: Medicaid Other | Admitting: Obstetrics and Gynecology

## 2023-08-23 VITALS — BP 124/76 | HR 64 | Ht 63.75 in | Wt 169.0 lb

## 2023-08-23 DIAGNOSIS — Z8742 Personal history of other diseases of the female genital tract: Secondary | ICD-10-CM

## 2023-08-23 DIAGNOSIS — Z1231 Encounter for screening mammogram for malignant neoplasm of breast: Secondary | ICD-10-CM

## 2023-08-23 DIAGNOSIS — Z124 Encounter for screening for malignant neoplasm of cervix: Secondary | ICD-10-CM | POA: Diagnosis present

## 2023-08-23 DIAGNOSIS — N951 Menopausal and female climacteric states: Secondary | ICD-10-CM | POA: Diagnosis not present

## 2023-08-23 DIAGNOSIS — R82998 Other abnormal findings in urine: Secondary | ICD-10-CM

## 2023-08-23 DIAGNOSIS — Z01419 Encounter for gynecological examination (general) (routine) without abnormal findings: Secondary | ICD-10-CM

## 2023-08-23 MED ORDER — ESTRADIOL 0.05 MG/24HR TD PTTW: 1.0000 | MEDICATED_PATCH | TRANSDERMAL | 3 refills | Status: AC

## 2023-08-23 MED ORDER — PAROXETINE HCL 20 MG PO TABS: 20.0000 mg | ORAL_TABLET | Freq: Every day | ORAL | 3 refills | Status: AC

## 2023-08-23 MED ORDER — ESTRADIOL 0.1 MG/GM VA CREA: TOPICAL_CREAM | VAGINAL | 3 refills | Status: DC

## 2023-08-23 MED ORDER — PROGESTERONE MICRONIZED 100 MG PO CAPS: 100.0000 mg | ORAL_CAPSULE | Freq: Every day | ORAL | 3 refills | Status: AC

## 2023-08-23 NOTE — Patient Instructions (Signed)

## 2023-08-24 LAB — FOLLICLE STIMULATING HORMONE: FSH: 110.1 m[IU]/mL

## 2023-08-24 LAB — ESTRADIOL: Estradiol: 20 pg/mL

## 2023-08-25 ENCOUNTER — Other Ambulatory Visit: Payer: Self-pay | Admitting: Obstetrics and Gynecology

## 2023-08-25 ENCOUNTER — Telehealth: Payer: Self-pay

## 2023-08-25 LAB — URINALYSIS, COMPLETE W/RFL CULTURE
Glucose, UA: NEGATIVE
Hyaline Cast: NONE SEEN /LPF
Ketones, ur: NEGATIVE
Leukocyte Esterase: NEGATIVE
Nitrites, Initial: NEGATIVE
Protein, ur: NEGATIVE
Specific Gravity, Urine: 1.025 (ref 1.001–1.035)
pH: 5.5 (ref 5.0–8.0)

## 2023-08-25 LAB — CYTOLOGY - PAP
Adequacy: ABSENT
Comment: NEGATIVE
Diagnosis: UNDETERMINED — AB
High risk HPV: NEGATIVE

## 2023-08-25 LAB — URINE CULTURE
MICRO NUMBER:: 15860443
Result:: NO GROWTH
SPECIMEN QUALITY:: ADEQUATE

## 2023-08-25 LAB — CULTURE INDICATED

## 2023-08-25 MED ORDER — PREMARIN 0.625 MG/GM VA CREA: TOPICAL_CREAM | VAGINAL | 3 refills | Status: AC

## 2023-08-25 NOTE — Progress Notes (Signed)
Request received for vaginal Premarin cream instead of estradiol cream.

## 2023-08-25 NOTE — Telephone Encounter (Signed)
LVMTCB for pt. Left message regarding her insurance not covering Estradiol medication. Asked pt if premarin cream could be a viable option for her. Left message for provider to change. Advised pt to call back.

## 2023-08-25 NOTE — Telephone Encounter (Signed)
I sent in a prescription for vaginal Premarin cream instead of estradiol cream.

## 2023-09-02 DIAGNOSIS — Z1231 Encounter for screening mammogram for malignant neoplasm of breast: Secondary | ICD-10-CM

## 2023-09-21 ENCOUNTER — Ambulatory Visit
Admission: RE | Admit: 2023-09-21 | Discharge: 2023-09-21 | Disposition: A | Discharge status: Home / Self Care | Payer: Medicaid Other | Source: Ambulatory Visit | Attending: Obstetrics and Gynecology | Admitting: Obstetrics and Gynecology

## 2023-09-21 DIAGNOSIS — Z1231 Encounter for screening mammogram for malignant neoplasm of breast: Secondary | ICD-10-CM

## 2023-11-07 ENCOUNTER — Telehealth: Admitting: Family Medicine

## 2023-11-07 ENCOUNTER — Ambulatory Visit: Payer: Self-pay | Admitting: Internal Medicine

## 2023-11-07 DIAGNOSIS — R6889 Other general symptoms and signs: Secondary | ICD-10-CM

## 2023-11-07 MED ORDER — OSELTAMIVIR PHOSPHATE 75 MG PO CAPS: 75.0000 mg | ORAL_CAPSULE | Freq: Two times a day (BID) | ORAL | 0 refills | Status: AC

## 2023-11-07 NOTE — Telephone Encounter (Signed)
 This RN called to triage pt, she had a virtual OV this AM and has a treatment plan. No further action needed.   Reason for Disposition . Nursing judgment  Protocols used: No Guideline or Reference Available-A-AH

## 2023-11-07 NOTE — Telephone Encounter (Signed)
 Red Word that prompted transfer to Nurse Triage: running slight fever sore throat Agent attempted to transfer call but unable to connect. Called pt and LM on VM.

## 2023-11-07 NOTE — Progress Notes (Signed)
 Virtual Visit Consent   Sarah Riley, you are scheduled for a virtual visit with a [facility] provider today. Just as with appointments in the office, your consent must be obtained to participate. Your consent will be active for this visit and any virtual visit you may have with one of our providers in the next 365 days. If you have a MyChart account, a copy of this consent can be sent to you electronically.  As this is a virtual visit, video technology does not allow for your provider to perform a traditional examination. This may limit your provider's ability to fully assess your condition. If your provider identifies any concerns that need to be evaluated in person or the need to arrange testing (such as labs, EKG, etc.), we will make arrangements to do so. Although advances in technology are sophisticated, we cannot ensure that it will always work on either your end or our end. If the connection with a video visit is poor, the visit may have to be switched to a telephone visit. With either a video or telephone visit, we are not always able to ensure that we have a secure connection.  By engaging in this virtual visit, you consent to the provision of healthcare and authorize for your insurance to be billed (if applicable) for the services provided during this visit. Depending on your insurance coverage, you may receive a charge related to this service.  I need to obtain your verbal consent now. Are you willing to proceed with your visit today? Kaylor Maiers has provided verbal consent on 11/07/2023 for a virtual visit (video or telephone). Freddy Finner, NP  Date: 11/07/2023 9:19 AM   Virtual Visit via Video Note   I, Freddy Finner, connected with  Sarah Riley  ([id number], [birth year]) on 11/07/23 at  9:15 AM EST by a video-enabled telemedicine application and verified that I am speaking with the correct person using two identifiers.  Location: Patient: Virtual Visit  Location Patient: Home Provider: Virtual Visit Location Provider: Home Office   I discussed the limitations of evaluation and management by telemedicine and the availability of in person appointments. The patient expressed understanding and agreed to proceed.    History of Present Illness: Sarah Riley is a 53 y.o. who identifies as a female who was assigned female at birth, and is being seen today for flu like symptoms  Onset was yesterday with bodyaches, headache  Associated symptoms are fever of 99, mild cough, congestion, tired  Modifying factors are motrin  Denies chest pain, shortness of breath  Exposure to sick contacts- known son + Flu   Problems:  Patient Active Problem List   Diagnosis Date Noted   Acute upper respiratory infection 11/22/2022   SI (sacroiliac) joint dysfunction 11/27/2013   Nonallopathic lesion of sacral region 11/27/2013   Cluster headache 02/10/2012   Preventative health care 08/09/2011   ALLERGIC RHINITIS 10/18/2008   FATIGUE 10/18/2008   ANEMIA-IRON DEFICIENCY 04/30/2008   History of colonic polyps 04/30/2008    Allergies:  Allergies  Allergen Reactions   Oxycodone Other (See Comments)    Feels loopy   Medications:  Current Outpatient Medications:    conjugated estrogens (PREMARIN) vaginal cream, Place 1 gram vaginally at bedtime two times per week as needed to maintain symptom relief., Disp: 30 g, Rfl: 3   estradiol (VIVELLE-DOT) 0.05 MG/24HR patch, Place 1 patch (0.05 mg total) onto the skin 2 (two) times a week., Disp: 24 patch, Rfl: 3  fexofenadine (ALLEGRA) 180 MG tablet, Take 180 mg by mouth daily., Disp: , Rfl:    levonorgestrel (MIRENA) 20 MCG/24HR IUD, 1 each by Intrauterine route once., Disp: , Rfl:    PARoxetine (PAXIL) 20 MG tablet, Take 1 tablet (20 mg total) by mouth daily., Disp: 90 tablet, Rfl: 3   progesterone (PROMETRIUM) 100 MG capsule, Take 1 capsule (100 mg total) by mouth at bedtime., Disp: 90 capsule, Rfl:  3  Observations/Objective: Patient is well-developed, well-nourished in no acute distress.  Resting comfortably  at home.  Head is normocephalic, atraumatic.  No labored breathing.  Speech is clear and coherent with logical content.  Patient is alert and oriented at baseline.    Assessment and Plan:  1. Flu-like symptoms (Primary)  - oseltamivir (TAMIFLU) 75 MG capsule; Take 1 capsule (75 mg total) by mouth 2 (two) times daily for 5 days.  Dispense: 10 capsule; Refill: 0  - Continue OTC symptomatic management of choice  - Take prescribed medications as directed - Push fluids - Rest as needed - Discussed return precautions and when to seek in-person evaluation, sent via AVS as well    Reviewed side effects, risks and benefits of medication.    Patient acknowledged agreement and understanding of the plan.   Past Medical, Surgical, Social History, Allergies, and Medications have been Reviewed.    Follow Up Instructions: I discussed the assessment and treatment plan with the patient. The patient was provided an opportunity to ask questions and all were answered. The patient agreed with the plan and demonstrated an understanding of the instructions.  A copy of instructions were sent to the patient via MyChart unless otherwise noted below.    The patient was advised to call back or seek an in-person evaluation if the symptoms worsen or if the condition fails to improve as anticipated.    Freddy Finner, NP

## 2023-11-07 NOTE — Patient Instructions (Signed)
  Sarah Riley, thank you for joining Freddy Finner, NP for today's virtual visit.  While this provider is not your primary care provider (PCP), if your PCP is located in our provider database this encounter information will be shared with them immediately following your visit.   A [facility] MyChart account gives you access to today's visit and all your visits, tests, and labs performed at [facility] " click here if you don't have a [facility] MyChart account or go to [email or web address]  Consent: (Patient) Sarah Riley provided verbal consent for this virtual visit at the beginning of the encounter.  Current Medications:  Current Outpatient Medications:    conjugated estrogens (PREMARIN) vaginal cream, Place 1 gram vaginally at bedtime two times per week as needed to maintain symptom relief., Disp: 30 g, Rfl: 3   oseltamivir (TAMIFLU) 75 MG capsule, Take 1 capsule (75 mg total) by mouth 2 (two) times daily for 5 days., Disp: 10 capsule, Rfl: 0   estradiol (VIVELLE-DOT) 0.05 MG/24HR patch, Place 1 patch (0.05 mg total) onto the skin 2 (two) times a week., Disp: 24 patch, Rfl: 3   fexofenadine (ALLEGRA) 180 MG tablet, Take 180 mg by mouth daily., Disp: , Rfl:    levonorgestrel (MIRENA) 20 MCG/24HR IUD, 1 each by Intrauterine route once., Disp: , Rfl:    PARoxetine (PAXIL) 20 MG tablet, Take 1 tablet (20 mg total) by mouth daily., Disp: 90 tablet, Rfl: 3   progesterone (PROMETRIUM) 100 MG capsule, Take 1 capsule (100 mg total) by mouth at bedtime., Disp: 90 capsule, Rfl: 3   Medications ordered in this encounter:  Meds ordered this encounter  Medications   oseltamivir (TAMIFLU) 75 MG capsule    Sig: Take 1 capsule (75 mg total) by mouth 2 (two) times daily for 5 days.    Dispense:  10 capsule    Refill:  0    Supervising Provider:   Merrilee Jansky [[id number]]     *If you need refills on other medications prior to your next appointment, please contact  your pharmacy*  Follow-Up: Call back or seek an in-person evaluation if the symptoms worsen or if the condition fails to improve as anticipated.  [facility] [phone]  Other Instructions  -rest -hydrate -take meds as discussed    If you have been instructed to have an in-person evaluation today at a local Urgent Care facility, please use the link below. It will take you to a list of all of our available [facility], including address, phone number and hours of operation. Please do not delay care.  [facility]  If you or a family member do not have a primary care provider, use the link below to schedule a visit and establish care. When you choose a [facility] primary care physician or advanced practice provider, you gain a long-term partner in health. Find a Primary Care Provider  Learn more about [facility]'s in-office and virtual care options: [facility] - Get Care Now

## 2023-11-07 NOTE — Telephone Encounter (Signed)
 Copied from CRM [id number]. Topic: Clinical - Red Word Triage >> Nov 07, 2023  8:14 AM Alphonzo Lemmings O wrote: [facility] that prompted transfer to Nurse Triage: running slight fever sore throat Attempted to reach pt x 3 and pt just finished a telehealth vist Reason for Disposition . Third attempt to contact caller AND no contact made. Phone number verified.  Protocols used: No Contact or Duplicate Contact Call-A-AH

## 2023-11-25 DIAGNOSIS — F411 Generalized anxiety disorder: Secondary | ICD-10-CM | POA: Diagnosis not present

## 2023-12-02 DIAGNOSIS — F411 Generalized anxiety disorder: Secondary | ICD-10-CM | POA: Diagnosis not present

## 2023-12-09 DIAGNOSIS — F411 Generalized anxiety disorder: Secondary | ICD-10-CM | POA: Diagnosis not present

## 2024-05-24 ENCOUNTER — Ambulatory Visit: Admitting: Family Medicine

## 2024-05-25 ENCOUNTER — Ambulatory Visit: Admitting: Family Medicine

## 2024-08-22 NOTE — Progress Notes (Deleted)
 53 y.o. G68P2002 Married African American female here for annual exam.    PCP: Norleen Lynwood ORN, MD   No LMP recorded. (Menstrual status: IUD).           Sexually active: Yes.    The current method of family planning is IUD Mirena  IUD 07-28-16 .    Menopausal hormone therapy:  Vivelle  & progesterone   Exercising: {yes no:314532}  {types:19826} Smoker:  no  OB History  Gravida Para Term Preterm AB Living  2 2 2   2   SAB IAB Ectopic Multiple Live Births          # Outcome Date GA Lbr Len/2nd Weight Sex Type Anes PTL Lv  2 Term 09/2005 [redacted]w[redacted]d  7 lb 14 oz (3.572 kg) F CS-Unspec     1 Term 09/2003 [redacted]w[redacted]d  7 lb 1 oz (3.204 kg) M CS-Unspec        HEALTH MAINTENANCE: Last 2 paps:  08/23/23 ASCUS, HR HPV neg, 08/25/22 ASCUS, HR HPV neg  History of abnormal Pap or positive HPV:  yes Mammogram:   09/21/23 Breast Density Cat C, BIRADS Cat 1 neg  Colonoscopy:   Bone Density:  n/a  Result  n/a   Immunization History  Administered Date(s) Administered   Influenza,inj,Quad PF,6+ Mos 08/24/2021   Tdap 04/08/2017      reports that she has never smoked. She has never used smokeless tobacco. She reports that she does not drink alcohol and does not use drugs.  Past Medical History:  Diagnosis Date   ALLERGIC RHINITIS 10/18/2008   ANEMIA-IRON DEFICIENCY 04/30/2008   ANEMIA-NOS 04/30/2008   COLONIC POLYPS, HX OF 04/30/2008    Past Surgical History:  Procedure Laterality Date   CESAREAN SECTION  x2   INTRAUTERINE DEVICE INSERTION     07-28-2016 inserted    Current Outpatient Medications  Medication Sig Dispense Refill   conjugated estrogens  (PREMARIN ) vaginal cream Place 1 gram vaginally at bedtime two times per week as needed to maintain symptom relief. 30 g 3   estradiol  (VIVELLE -DOT) 0.05 MG/24HR patch Place 1 patch (0.05 mg total) onto the skin 2 (two) times a week. 24 patch 3   fexofenadine (ALLEGRA) 180 MG tablet Take 180 mg by mouth daily.     levonorgestrel  (MIRENA ) 20 MCG/24HR IUD  1 each by Intrauterine route once.     PARoxetine  (PAXIL ) 20 MG tablet Take 1 tablet (20 mg total) by mouth daily. 90 tablet 3   progesterone  (PROMETRIUM ) 100 MG capsule Take 1 capsule (100 mg total) by mouth at bedtime. 90 capsule 3   No current facility-administered medications for this visit.    ALLERGIES: Oxycodone  Family History  Problem Relation Age of Onset   Hypertension Mother    Clotting disorder Father    Alcoholism Father     Review of Systems  PHYSICAL EXAM:  There were no vitals taken for this visit.    General appearance: alert, cooperative and appears stated age Head: normocephalic, without obvious abnormality, atraumatic Neck: no adenopathy, supple, symmetrical, trachea midline and thyroid  normal to inspection and palpation Lungs: clear to auscultation bilaterally Breasts: normal appearance, no masses or tenderness, No nipple retraction or dimpling, No nipple discharge or bleeding, No axillary adenopathy Heart: regular rate and rhythm Abdomen: soft, non-tender; no masses, no organomegaly Extremities: extremities normal, atraumatic, no cyanosis or edema Skin: skin color, texture, turgor normal. No rashes or lesions Lymph nodes: cervical, supraclavicular, and axillary nodes normal. Neurologic: grossly normal  Pelvic: External genitalia:  no lesions              No abnormal inguinal nodes palpated.              Urethra:  normal appearing urethra with no masses, tenderness or lesions              Bartholins and Skenes: normal                 Vagina: normal appearing vagina with normal color and discharge, no lesions              Cervix: no lesions              Pap taken: {yes no:314532} Bimanual Exam:  Uterus:  normal size, contour, position, consistency, mobility, non-tender              Adnexa: no mass, fullness, tenderness              Rectal exam: {yes no:314532}.  Confirms.              Anus:  normal sphincter tone, no lesions  Chaperone was present for  exam:  {BSCHAPERONE:31226::Emily F, CMA}  ASSESSMENT: Well woman visit with gynecologic exam.  PHQ-2-9: ***  ***  PLAN: Mammogram screening discussed. Self breast awareness reviewed. Pap and HRV collected:  {yes no:314532} Guidelines for Calcium, Vitamin D, regular exercise program including cardiovascular and weight bearing exercise. Medication refills:  *** {LABS (Optional):23779} Follow up:  ***    Additional counseling given.  {yes [id number]. ***  total time was spent for this patient encounter, including preparation, face-to-face counseling with the patient, coordination of care, and documentation of the encounter in addition to doing the well woman visit with gynecologic exam.

## 2024-08-23 ENCOUNTER — Ambulatory Visit: Payer: Medicaid Other | Admitting: Obstetrics and Gynecology

## 2024-10-09 ENCOUNTER — Encounter: Payer: Self-pay | Admitting: Internal Medicine

## 2024-10-09 ENCOUNTER — Ambulatory Visit: Payer: Self-pay

## 2024-10-09 ENCOUNTER — Ambulatory Visit: Admitting: Internal Medicine

## 2024-10-09 VITALS — BP 110/74 | HR 94 | Temp 98.2°F | Ht 62.0 in | Wt 169.0 lb

## 2024-10-09 DIAGNOSIS — R17 Unspecified jaundice: Secondary | ICD-10-CM | POA: Insufficient documentation

## 2024-10-09 DIAGNOSIS — R8281 Pyuria: Secondary | ICD-10-CM | POA: Insufficient documentation

## 2024-10-09 DIAGNOSIS — Z114 Encounter for screening for human immunodeficiency virus [HIV]: Secondary | ICD-10-CM | POA: Insufficient documentation

## 2024-10-09 DIAGNOSIS — Z1211 Encounter for screening for malignant neoplasm of colon: Secondary | ICD-10-CM | POA: Insufficient documentation

## 2024-10-09 DIAGNOSIS — R7989 Other specified abnormal findings of blood chemistry: Secondary | ICD-10-CM

## 2024-10-09 DIAGNOSIS — R748 Abnormal levels of other serum enzymes: Secondary | ICD-10-CM

## 2024-10-09 LAB — CBC WITH DIFFERENTIAL/PLATELET
Basophils Absolute: 0 10*3/uL (ref 0.0–0.1)
Basophils Relative: 0.7 % (ref 0.0–3.0)
Eosinophils Absolute: 0.2 10*3/uL (ref 0.0–0.7)
Eosinophils Relative: 3 % (ref 0.0–5.0)
HCT: 40.7 % (ref 36.0–46.0)
Hemoglobin: 14 g/dL (ref 12.0–15.0)
Lymphocytes Relative: 33.7 % (ref 12.0–46.0)
Lymphs Abs: 1.9 10*3/uL (ref 0.7–4.0)
MCHC: 34.5 g/dL (ref 30.0–36.0)
MCV: 93 fl (ref 78.0–100.0)
Monocytes Absolute: 0.9 10*3/uL (ref 0.1–1.0)
Monocytes Relative: 16.3 % — ABNORMAL HIGH (ref 3.0–12.0)
Neutro Abs: 2.7 10*3/uL (ref 1.4–7.7)
Neutrophils Relative %: 46.3 % (ref 43.0–77.0)
Platelets: 247 10*3/uL (ref 150.0–400.0)
RBC: 4.38 Mil/uL (ref 3.87–5.11)
RDW: 15.4 % (ref 11.5–15.5)
WBC: 5.8 10*3/uL (ref 4.0–10.5)

## 2024-10-09 LAB — HEPATIC FUNCTION PANEL
ALT: 1269 U/L — ABNORMAL HIGH (ref 3–35)
AST: 1249 U/L — ABNORMAL HIGH (ref 5–37)
Albumin: 3.5 g/dL (ref 3.5–5.2)
Alkaline Phosphatase: 216 U/L — ABNORMAL HIGH (ref 39–117)
Bilirubin, Direct: 7.1 mg/dL — ABNORMAL HIGH (ref 0.1–0.3)
Total Bilirubin: 10.5 mg/dL — ABNORMAL HIGH (ref 0.2–1.2)
Total Protein: 7.3 g/dL (ref 6.0–8.3)

## 2024-10-09 LAB — BASIC METABOLIC PANEL WITH GFR
BUN: 11 mg/dL (ref 6–23)
CO2: 23 meq/L (ref 19–32)
Calcium: 9.6 mg/dL (ref 8.4–10.5)
Chloride: 105 meq/L (ref 96–112)
Creatinine, Ser: 0.76 mg/dL (ref 0.40–1.20)
GFR: 89.54 mL/min
Glucose, Bld: 112 mg/dL — ABNORMAL HIGH (ref 70–99)
Potassium: 3.6 meq/L (ref 3.5–5.1)
Sodium: 136 meq/L (ref 135–145)

## 2024-10-09 LAB — TSH: TSH: 0.78 u[IU]/mL (ref 0.35–5.50)

## 2024-10-09 NOTE — Progress Notes (Unsigned)
 "  Subjective:  Patient ID: Sarah Riley, female    DOB: [birth year]  Age: 54 y.o. MRN: [id number]  CC: No chief complaint on file.   HPI Yalexa Blust presents for ***  Discussed the use of AI scribe software for clinical note transcription with the patient, who gave verbal consent to proceed.  History of Present Illness Sarah Riley is a 54 year old female who presents with jaundice and dark urine.  She has noticed yellowing of her eyes today and dark urine for the past three to four days. No changes in stool color, specifically noting that she is not pale. The patient reports that she is not currently active.  She is experiencing significant stress due to a recent legal separation, the passing of her mother last year, and having two children in college. She feels that her hair is shedding more than usual and attributes it to stress. She is currently taking Paxil  for stress and has been prescribed hormone medication for menopause, which she does not take. She also mentions taking Ronal Ruth's vitamins for hair growth inconsistently.  She reports chronic insomnia, estimating about two hours of sleep per night, which is not consistent. No changes in weight or appetite, and her weight has been stable for the last few years. No chest pain or shortness of breath beyond what she considers typical when exercising. No use of anti-inflammatories or supplements other than the mentioned vitamins and Sudafed for a recent cold.  No weakness, dizziness, or lightheadedness, and her blood pressure is usually low. No diarrhea or constipation.     Outpatient Medications Prior to Visit  Medication Sig Dispense Refill   conjugated estrogens  (PREMARIN ) vaginal cream Place 1 gram vaginally at bedtime two times per week as needed to maintain symptom relief. 30 g 3   estradiol  (VIVELLE -DOT) 0.05 MG/24HR patch Place 1 patch (0.05 mg total) onto the skin 2 (two) times a week. 24 patch 3    fexofenadine (ALLEGRA) 180 MG tablet Take 180 mg by mouth daily.     levonorgestrel  (MIRENA ) 20 MCG/24HR IUD 1 each by Intrauterine route once.     PARoxetine  (PAXIL ) 20 MG tablet Take 1 tablet (20 mg total) by mouth daily. 90 tablet 3   progesterone  (PROMETRIUM ) 100 MG capsule Take 1 capsule (100 mg total) by mouth at bedtime. 90 capsule 3   No facility-administered medications prior to visit.    ROS Review of Systems  Objective:  BP 110/74   Pulse 94   Temp 98.2 F (36.8 C) (Oral)   Ht 5' 2 (1.575 m)   Wt 169 lb (76.7 kg)   SpO2 99%   BMI 30.91 kg/m   BP Readings from Last 3 Encounters:  10/09/24 110/74  08/23/23 124/76  11/22/22 120/78    Wt Readings from Last 3 Encounters:  10/09/24 169 lb (76.7 kg)  08/23/23 169 lb (76.7 kg)  11/22/22 162 lb (73.5 kg)    Physical Exam  Lab Results  Component Value Date   WBC 5.8 08/24/2021   HGB 14.4 08/24/2021   HCT 42.9 08/24/2021   PLT 224 08/24/2021   GLUCOSE 71 08/24/2021   CHOL 205 (H) 08/24/2021   TRIG 60 08/24/2021   HDL 54 08/24/2021   LDLCALC 136 (H) 08/24/2021   ALT 21 08/24/2021   AST 23 08/24/2021   NA 139 08/24/2021   K 4.2 08/24/2021   CL 104 08/24/2021   CREATININE 0.90 08/24/2021   BUN 8 08/24/2021  CO2 25 08/24/2021    MM 3D SCREENING MAMMOGRAM BILATERAL BREAST Result Date: 09/22/2023 CLINICAL DATA:  Screening. EXAM: DIGITAL SCREENING BILATERAL MAMMOGRAM WITH TOMOSYNTHESIS AND CAD TECHNIQUE: Bilateral screening digital craniocaudal and mediolateral oblique mammograms were obtained. Bilateral screening digital breast tomosynthesis was performed. The images were evaluated with computer-aided detection. COMPARISON:  Previous exam(s). ACR Breast Density Category c: The breasts are heterogeneously dense, which may obscure small masses. FINDINGS: There are no findings suspicious for malignancy. IMPRESSION: No mammographic evidence of malignancy. A result letter of this screening mammogram will be mailed  directly to the patient. RECOMMENDATION: Screening mammogram in one year. (Code:SM-B-01Y) BI-RADS CATEGORY  1: Negative. Electronically Signed   By: Craig Farr M.D.   On: 09/22/2023 07:16    Assessment & Plan:  Scleral icterus -     Basic metabolic panel with GFR; Future -     CBC with Differential/Platelet; Future -     Hepatic function panel; Future -     TSH; Future -     Protime-INR; Future -     Hepatitis panel, acute; Future  Pyuria -     Urinalysis, Routine w reflex microscopic; Future -     CULTURE, URINE COMPREHENSIVE; Future  Encounter for screening for HIV -     HIV Antibody (routine testing w rflx); Future  Screening for colon cancer -     Ambulatory referral to Gastroenterology     Follow-up: Return in about 4 weeks (around 11/06/2024).  Debby Molt, MD "

## 2024-10-10 ENCOUNTER — Ambulatory Visit: Payer: Self-pay | Admitting: Internal Medicine

## 2024-10-10 DIAGNOSIS — R7989 Other specified abnormal findings of blood chemistry: Secondary | ICD-10-CM | POA: Insufficient documentation

## 2024-10-10 DIAGNOSIS — R748 Abnormal levels of other serum enzymes: Secondary | ICD-10-CM | POA: Insufficient documentation

## 2024-10-10 LAB — URINALYSIS, ROUTINE W REFLEX MICROSCOPIC
Ketones, ur: 15 — AB
Leukocytes,Ua: NEGATIVE
Nitrite: POSITIVE — AB
Specific Gravity, Urine: >=1.03 — AB (ref 1.000–1.030)
Total Protein, Urine: 30 — AB
Urine Glucose: 100 — AB
Urobilinogen, UA: 4 — AB (ref 0.0–1.0)
pH: 5 (ref 5.0–8.0)

## 2024-10-11 ENCOUNTER — Telehealth: Payer: Self-pay

## 2024-10-11 LAB — HEPATITIS PANEL, ACUTE
Hep A IgM: NONREACTIVE
Hep B C IgM: NONREACTIVE
Hepatitis B Surface Ag: NONREACTIVE
Hepatitis C Ab: NONREACTIVE

## 2024-10-11 LAB — PROTIME-INR
INR: 1.2 — ABNORMAL HIGH
Prothrombin Time: 12.2 s — ABNORMAL HIGH (ref 9.0–11.5)

## 2024-10-11 LAB — HIV ANTIBODY (ROUTINE TESTING W REFLEX)
HIV 1&2 Ab, 4th Generation: NONREACTIVE
HIV FINAL INTERPRETATION: NEGATIVE

## 2024-10-11 LAB — CULTURE, URINE COMPREHENSIVE: RESULT:: NO GROWTH

## 2024-10-11 NOTE — Telephone Encounter (Signed)
 Copied from CRM [id number]. Topic: Clinical - Lab/Test Results >> Oct 11, 2024  9:34 AM [name] wrote: Reason for CRM: Patient got a MyChart to schedule a follow up lab for Friday, but ptient is on her way out of town and will not be back until Sunday, scheduled follow up lab on Monday, also wanted to note that she is taking an OTC vitamin, premium feminine balance gummies, has coloring in it, so thinks that's why her urine was colored.   Patient can be reached at [phone]

## 2024-10-11 NOTE — Telephone Encounter (Signed)
 Please advise.

## 2024-10-12 ENCOUNTER — Telehealth: Payer: Self-pay

## 2024-10-12 ENCOUNTER — Ambulatory Visit: Payer: Self-pay

## 2024-10-12 NOTE — Telephone Encounter (Signed)
 She is in liver failure Is she willing to be admitted?

## 2024-10-12 NOTE — Telephone Encounter (Signed)
 Copied from CRM [id number]. Topic: Referral - Question >> Oct 12, 2024  8:31 AM Robinson H wrote: Reason for CRM: Patient has a referral for a MRI/CAT Scan/Imaging, states her appointment isn't until next Friday and wants to know if there is somewhere else she can go to have it done sooner.  [facility] [phone]

## 2024-10-12 NOTE — Telephone Encounter (Signed)
 Copied from CRM #[id number]. Topic: General - Other >> Oct 12, 2024  1:04 PM Ahlexyia S wrote: Reason for CRM: Pt called in stating that she was told that she has liver failure and needs to be admitted to the hospital. Pt stated she is currently coming back from [place]  and since she is flying back into [place] she is going to go to [facility] instead of coming to [facility]. Pt stated she will be on the flight for a hour and a half. Pt agreed to be contacted for any further questions.

## 2024-10-12 NOTE — Telephone Encounter (Signed)
 The patient is currently at [facility] hospital to be seen. She states that they are backed up and asked should she leave. Suzen advised her to stay there so that she can be seen because coming here would start the process over. She gave a verbal understanding. Also we advised the patient that she may end up getting that MRI/CT done while she is in the hospital so don't be too worried about her appointment being scheduled so far out. Patient gave a verbal understanding yet again

## 2024-10-12 NOTE — Telephone Encounter (Signed)
 FYI Only or Action Required?: FYI only for provider: ED advised.  Patient was last seen in primary care on 10/09/2024 by Joshua Debby CROME, MD.  Called Nurse Triage reporting Release of Information.     Triage Disposition: Information or Advice Only Call  Patient/caregiver understands and will follow disposition?: Yes   Message from China J sent at 10/12/2024  2:46 PM EST  Reason for Triage: Patient is currently in liver failure and her flight just landed. She has been instructed to be admitted to the hospital but is needing direction on where to go or what to expect who she arrives to [facility]. As of now, the patient is feeling tired and weak.   Reason for Disposition  Health information question, no triage required and triager able to answer question  Answer Assessment - Initial Assessment Questions 1. REASON FOR CALL: What is the main reason for your call? or How can I best help you?     Pt states she just landed in [place] and wants to know next steps as she was advised to be admitted into hospital for liver failure.  Pt states she would like to go to [facility]. Process of admission explained to patient and informed patient that admission begins in the ER. Pt advised to go to ED of her choosing.  Protocols used: Information Only Call - No Triage-A-AH

## 2024-10-12 NOTE — Telephone Encounter (Signed)
 FYI Only or Action Required?: FYI only for provider: was able to reach patient, she has been seen at [facility] by a triage nurse and plans to stay there (in ED).  Patient was last seen in primary care on 10/09/2024 by Joshua Debby CROME, MD.  Called Nurse Triage reporting Follow-up (Follow-up regarding previous question, no triage. ).  Triage Disposition: Duplicate Contact Calls  Patient/caregiver understands and will follow disposition?: Yes     Copied from CRM #[id number]. Topic: Clinical - Medical Advice >> Oct 12, 2024  3:53 PM Sarah Riley wrote: Reason for CRM: Pt called in stating that she has landed and is currently at [facility]. Pt stated that they are currently packed and wanted to know if it was okay for her to go to [facility] in [place]. Contacted CAL and was told by front desk from the clinical manager to inform pt to stay where she is and get medical attention. During the call with CAL the line disconnected with pt, I attempted to contact pt back but there was no answer. If someone could reach out to pt to inform her.     Reason for Disposition  Caller has already spoken with another triager and has no further questions.  Answer Assessment - Initial Assessment Questions This Triage RN calls patient to inform of previous information that could not be relayed due to call dropping.  Patient states she remains at [facility] and has been seen by a triage nurse.  Will stay at [facility] as advised by this Triage RN based on prior communications with patient'[facility] PCP office.  No further questions or needs.  Protocols used: No Contact or Duplicate Contact Call-A-AH

## 2024-10-12 NOTE — Telephone Encounter (Signed)
 Patent states that she is in [place] an doesn't want [email or web address] be admitted there. What is she supposed to do now ?

## 2024-10-15 ENCOUNTER — Other Ambulatory Visit: Payer: Self-pay

## 2024-10-19 ENCOUNTER — Other Ambulatory Visit

## 2024-11-20 ENCOUNTER — Ambulatory Visit: Admitting: Obstetrics and Gynecology

## 2024-12-27 ENCOUNTER — Encounter: Admitting: Family Medicine
